# Patient Record
Sex: Female | Born: 1950 | Race: White | Hispanic: No | Marital: Married | Smoking: Former smoker
Health system: Southern US, Community
[De-identification: ages and names within clinical notes are randomized; demographics above are authoritative.]

## PROBLEM LIST (undated history)

## (undated) DIAGNOSIS — I10 Essential (primary) hypertension: Secondary | ICD-10-CM

## (undated) HISTORY — PX: AUGMENTATION MAMMAPLASTY: SUR837

## (undated) HISTORY — PX: ABDOMINAL HYSTERECTOMY: SHX81

---

## 2004-04-09 ENCOUNTER — Encounter: Payer: Self-pay | Admitting: Internal Medicine

## 2004-05-10 ENCOUNTER — Encounter: Payer: Self-pay | Admitting: Internal Medicine

## 2004-06-09 ENCOUNTER — Encounter: Payer: Self-pay | Admitting: Internal Medicine

## 2004-07-14 ENCOUNTER — Encounter: Payer: Self-pay | Admitting: Internal Medicine

## 2004-08-10 ENCOUNTER — Encounter: Payer: Self-pay | Admitting: Internal Medicine

## 2004-09-07 ENCOUNTER — Encounter: Payer: Self-pay | Admitting: Internal Medicine

## 2004-09-23 ENCOUNTER — Ambulatory Visit: Payer: Self-pay | Admitting: Unknown Physician Specialty

## 2004-10-07 ENCOUNTER — Ambulatory Visit: Payer: Self-pay | Admitting: Unknown Physician Specialty

## 2004-10-08 ENCOUNTER — Encounter: Payer: Self-pay | Admitting: Internal Medicine

## 2004-11-07 ENCOUNTER — Encounter: Payer: Self-pay | Admitting: Internal Medicine

## 2004-12-08 ENCOUNTER — Encounter: Payer: Self-pay | Admitting: Internal Medicine

## 2005-01-07 ENCOUNTER — Encounter: Payer: Self-pay | Admitting: Internal Medicine

## 2005-02-07 ENCOUNTER — Encounter: Payer: Self-pay | Admitting: Internal Medicine

## 2005-04-06 ENCOUNTER — Encounter: Payer: Self-pay | Admitting: Internal Medicine

## 2005-04-09 ENCOUNTER — Encounter: Payer: Self-pay | Admitting: Internal Medicine

## 2005-05-10 ENCOUNTER — Encounter: Payer: Self-pay | Admitting: Internal Medicine

## 2005-05-19 ENCOUNTER — Ambulatory Visit: Payer: Self-pay | Admitting: General Surgery

## 2005-05-30 ENCOUNTER — Ambulatory Visit: Payer: Self-pay | Admitting: General Surgery

## 2005-06-09 ENCOUNTER — Encounter: Payer: Self-pay | Admitting: Internal Medicine

## 2005-07-10 ENCOUNTER — Encounter: Payer: Self-pay | Admitting: Internal Medicine

## 2005-08-10 ENCOUNTER — Encounter: Payer: Self-pay | Admitting: Internal Medicine

## 2005-09-07 ENCOUNTER — Encounter: Payer: Self-pay | Admitting: Internal Medicine

## 2005-10-09 ENCOUNTER — Encounter: Payer: Self-pay | Admitting: Internal Medicine

## 2005-11-07 ENCOUNTER — Encounter: Payer: Self-pay | Admitting: Internal Medicine

## 2005-12-08 ENCOUNTER — Encounter: Payer: Self-pay | Admitting: Internal Medicine

## 2005-12-15 ENCOUNTER — Ambulatory Visit: Payer: Self-pay | Admitting: General Surgery

## 2006-01-07 ENCOUNTER — Encounter: Payer: Self-pay | Admitting: Internal Medicine

## 2006-02-07 ENCOUNTER — Encounter: Payer: Self-pay | Admitting: Internal Medicine

## 2006-03-10 ENCOUNTER — Encounter: Payer: Self-pay | Admitting: Internal Medicine

## 2006-04-09 ENCOUNTER — Encounter: Payer: Self-pay | Admitting: Internal Medicine

## 2006-05-10 ENCOUNTER — Encounter: Payer: Self-pay | Admitting: Internal Medicine

## 2006-06-09 ENCOUNTER — Encounter: Payer: Self-pay | Admitting: Internal Medicine

## 2006-07-20 ENCOUNTER — Encounter: Payer: Self-pay | Admitting: Internal Medicine

## 2006-08-10 ENCOUNTER — Encounter: Payer: Self-pay | Admitting: Internal Medicine

## 2006-09-08 ENCOUNTER — Encounter: Payer: Self-pay | Admitting: Internal Medicine

## 2006-10-09 ENCOUNTER — Encounter: Payer: Self-pay | Admitting: Internal Medicine

## 2006-11-08 ENCOUNTER — Encounter: Payer: Self-pay | Admitting: Internal Medicine

## 2006-12-09 ENCOUNTER — Encounter: Payer: Self-pay | Admitting: Internal Medicine

## 2007-01-08 ENCOUNTER — Encounter: Payer: Self-pay | Admitting: Internal Medicine

## 2007-02-01 ENCOUNTER — Ambulatory Visit: Payer: Self-pay | Admitting: General Surgery

## 2007-02-08 ENCOUNTER — Encounter: Payer: Self-pay | Admitting: Internal Medicine

## 2007-03-11 ENCOUNTER — Encounter: Payer: Self-pay | Admitting: Internal Medicine

## 2007-04-10 ENCOUNTER — Encounter: Payer: Self-pay | Admitting: Internal Medicine

## 2007-05-11 ENCOUNTER — Encounter: Payer: Self-pay | Admitting: Internal Medicine

## 2007-06-10 ENCOUNTER — Encounter: Payer: Self-pay | Admitting: Internal Medicine

## 2007-07-11 ENCOUNTER — Encounter: Payer: Self-pay | Admitting: Internal Medicine

## 2007-08-11 ENCOUNTER — Encounter: Payer: Self-pay | Admitting: Internal Medicine

## 2007-09-08 ENCOUNTER — Encounter: Payer: Self-pay | Admitting: Internal Medicine

## 2007-10-09 ENCOUNTER — Encounter: Payer: Self-pay | Admitting: Internal Medicine

## 2007-11-08 ENCOUNTER — Encounter: Payer: Self-pay | Admitting: Internal Medicine

## 2007-11-25 ENCOUNTER — Ambulatory Visit: Payer: Self-pay | Admitting: Internal Medicine

## 2007-11-25 ENCOUNTER — Ambulatory Visit: Payer: Self-pay | Admitting: Unknown Physician Specialty

## 2007-12-09 ENCOUNTER — Encounter: Payer: Self-pay | Admitting: Internal Medicine

## 2007-12-10 ENCOUNTER — Ambulatory Visit: Payer: Self-pay | Admitting: Unknown Physician Specialty

## 2007-12-13 ENCOUNTER — Encounter: Admission: RE | Admit: 2007-12-13 | Discharge: 2007-12-13 | Payer: Self-pay

## 2008-01-08 ENCOUNTER — Encounter: Payer: Self-pay | Admitting: Internal Medicine

## 2008-02-08 ENCOUNTER — Encounter: Payer: Self-pay | Admitting: Internal Medicine

## 2008-03-10 ENCOUNTER — Encounter: Payer: Self-pay | Admitting: Internal Medicine

## 2008-04-09 ENCOUNTER — Encounter: Payer: Self-pay | Admitting: Internal Medicine

## 2008-05-15 ENCOUNTER — Encounter: Payer: Self-pay | Admitting: Internal Medicine

## 2008-06-09 ENCOUNTER — Encounter: Payer: Self-pay | Admitting: Internal Medicine

## 2008-07-10 ENCOUNTER — Encounter: Payer: Self-pay | Admitting: Internal Medicine

## 2008-08-10 ENCOUNTER — Encounter: Payer: Self-pay | Admitting: Internal Medicine

## 2008-09-07 ENCOUNTER — Encounter: Payer: Self-pay | Admitting: Internal Medicine

## 2008-10-08 ENCOUNTER — Encounter: Payer: Self-pay | Admitting: Internal Medicine

## 2008-11-07 ENCOUNTER — Encounter: Payer: Self-pay | Admitting: Internal Medicine

## 2008-12-08 ENCOUNTER — Encounter: Payer: Self-pay | Admitting: Internal Medicine

## 2009-01-07 ENCOUNTER — Encounter: Payer: Self-pay | Admitting: Internal Medicine

## 2009-02-07 ENCOUNTER — Encounter: Payer: Self-pay | Admitting: Internal Medicine

## 2009-03-10 ENCOUNTER — Encounter: Payer: Self-pay | Admitting: Internal Medicine

## 2009-04-09 ENCOUNTER — Encounter: Payer: Self-pay | Admitting: Internal Medicine

## 2009-05-10 ENCOUNTER — Encounter: Payer: Self-pay | Admitting: Internal Medicine

## 2009-06-09 ENCOUNTER — Encounter: Payer: Self-pay | Admitting: Internal Medicine

## 2009-06-17 ENCOUNTER — Ambulatory Visit: Payer: Self-pay | Admitting: Cardiology

## 2009-06-24 ENCOUNTER — Ambulatory Visit: Payer: Self-pay | Admitting: Cardiology

## 2009-10-11 ENCOUNTER — Ambulatory Visit: Payer: Self-pay | Admitting: Unknown Physician Specialty

## 2009-11-23 ENCOUNTER — Ambulatory Visit: Payer: Self-pay | Admitting: General Surgery

## 2010-06-10 ENCOUNTER — Ambulatory Visit: Payer: Self-pay | Admitting: General Surgery

## 2010-11-18 ENCOUNTER — Ambulatory Visit: Payer: Self-pay | Admitting: Unknown Physician Specialty

## 2012-12-20 ENCOUNTER — Ambulatory Visit: Payer: Self-pay | Admitting: Orthopedic Surgery

## 2012-12-27 ENCOUNTER — Ambulatory Visit: Payer: Self-pay | Admitting: Nurse Practitioner

## 2013-03-31 ENCOUNTER — Other Ambulatory Visit: Payer: Self-pay | Admitting: Orthopedic Surgery

## 2013-03-31 DIAGNOSIS — M549 Dorsalgia, unspecified: Secondary | ICD-10-CM

## 2013-03-31 DIAGNOSIS — M542 Cervicalgia: Secondary | ICD-10-CM

## 2013-04-09 MED ORDER — DIAZEPAM 5 MG PO TABS: 10.0000 mg | ORAL_TABLET | Freq: Once | ORAL | Status: DC

## 2013-04-11 ENCOUNTER — Ambulatory Visit
Admission: RE | Admit: 2013-04-11 | Discharge: 2013-04-11 | Disposition: A | Discharge status: Home / Self Care | Payer: BC Managed Care – PPO | Source: Ambulatory Visit | Attending: Orthopedic Surgery | Admitting: Orthopedic Surgery

## 2013-04-11 ENCOUNTER — Ambulatory Visit
Admission: RE | Admit: 2013-04-11 | Discharge: 2013-04-11 | Disposition: A | Discharge status: Home / Self Care | Payer: No Typology Code available for payment source | Source: Ambulatory Visit | Attending: Orthopedic Surgery | Admitting: Orthopedic Surgery

## 2013-04-11 VITALS — BP 166/88 | HR 58 | Resp 14 | Ht 60.0 in | Wt 115.0 lb

## 2013-04-11 DIAGNOSIS — M549 Dorsalgia, unspecified: Secondary | ICD-10-CM

## 2013-04-11 DIAGNOSIS — M542 Cervicalgia: Secondary | ICD-10-CM

## 2013-04-11 MED ORDER — IOHEXOL 300 MG/ML  SOLN
10.0000 mL | Freq: Once | INTRAMUSCULAR | Status: AC | PRN
  Administered 2013-04-11: 10 mL via INTRATHECAL

## 2013-04-11 NOTE — Progress Notes (Signed)
1030  Patient resting comfortably post procedure.    1100 Up to restroom with minimal assistance.  Tolerated well.  1130  Patient & husband given verbal discharge instructions & state they understand.    1145  Discharged to home.   Husband to drive.    Valin Massie Carmell Austria, RN 04/11/2013 1:37 PM

## 2016-07-06 ENCOUNTER — Other Ambulatory Visit: Payer: Self-pay | Admitting: Obstetrics & Gynecology

## 2016-07-06 DIAGNOSIS — Z1231 Encounter for screening mammogram for malignant neoplasm of breast: Secondary | ICD-10-CM

## 2016-08-21 ENCOUNTER — Other Ambulatory Visit: Payer: Self-pay | Admitting: Obstetrics & Gynecology

## 2016-08-21 ENCOUNTER — Ambulatory Visit
Admission: RE | Admit: 2016-08-21 | Discharge: 2016-08-21 | Disposition: A | Discharge status: Home / Self Care | Payer: Medicare Other | Source: Ambulatory Visit | Attending: Obstetrics & Gynecology | Admitting: Obstetrics & Gynecology

## 2016-08-21 DIAGNOSIS — Z1231 Encounter for screening mammogram for malignant neoplasm of breast: Secondary | ICD-10-CM

## 2016-08-24 ENCOUNTER — Other Ambulatory Visit: Payer: Self-pay | Admitting: *Deleted

## 2016-08-24 ENCOUNTER — Inpatient Hospital Stay
Admission: RE | Admit: 2016-08-24 | Discharge: 2016-08-24 | Disposition: A | Discharge status: Home / Self Care | Payer: Self-pay | Source: Ambulatory Visit | Attending: *Deleted | Admitting: *Deleted

## 2016-08-24 DIAGNOSIS — Z9289 Personal history of other medical treatment: Secondary | ICD-10-CM

## 2016-09-28 ENCOUNTER — Other Ambulatory Visit: Payer: Self-pay | Admitting: Certified Nurse Midwife

## 2017-07-24 ENCOUNTER — Other Ambulatory Visit: Payer: Self-pay | Admitting: Obstetrics & Gynecology

## 2017-07-24 DIAGNOSIS — Z1231 Encounter for screening mammogram for malignant neoplasm of breast: Secondary | ICD-10-CM

## 2017-09-03 ENCOUNTER — Ambulatory Visit
Admission: RE | Admit: 2017-09-03 | Discharge: 2017-09-03 | Disposition: A | Discharge status: Home / Self Care | Payer: Medicare Other | Source: Ambulatory Visit | Attending: Obstetrics & Gynecology | Admitting: Obstetrics & Gynecology

## 2017-09-03 DIAGNOSIS — Z1231 Encounter for screening mammogram for malignant neoplasm of breast: Secondary | ICD-10-CM | POA: Diagnosis present

## 2017-11-22 ENCOUNTER — Other Ambulatory Visit: Payer: Self-pay | Admitting: Nurse Practitioner

## 2017-11-22 DIAGNOSIS — M542 Cervicalgia: Secondary | ICD-10-CM

## 2017-11-29 ENCOUNTER — Ambulatory Visit
Admission: RE | Admit: 2017-11-29 | Discharge: 2017-11-29 | Disposition: A | Discharge status: Home / Self Care | Payer: Medicare Other | Source: Ambulatory Visit | Attending: Nurse Practitioner | Admitting: Nurse Practitioner

## 2017-11-29 DIAGNOSIS — I639 Cerebral infarction, unspecified: Secondary | ICD-10-CM | POA: Insufficient documentation

## 2017-11-29 DIAGNOSIS — M542 Cervicalgia: Secondary | ICD-10-CM | POA: Diagnosis present

## 2017-11-29 DIAGNOSIS — I998 Other disorder of circulatory system: Secondary | ICD-10-CM | POA: Diagnosis not present

## 2017-11-29 DIAGNOSIS — G319 Degenerative disease of nervous system, unspecified: Secondary | ICD-10-CM | POA: Diagnosis not present

## 2017-11-29 HISTORY — DX: Essential (primary) hypertension: I10

## 2017-11-29 MED ORDER — IOPAMIDOL (ISOVUE-300) INJECTION 61%
75.0000 mL | Freq: Once | INTRAVENOUS | Status: AC | PRN
  Administered 2017-11-29: 75 mL via INTRAVENOUS

## 2017-12-07 ENCOUNTER — Encounter: Admission: RE | Payer: Self-pay | Source: Ambulatory Visit

## 2017-12-07 ENCOUNTER — Ambulatory Visit: Admission: RE | Admit: 2017-12-07 | Payer: Medicare Other | Source: Ambulatory Visit | Admitting: Gastroenterology

## 2017-12-07 SURGERY — COLONOSCOPY WITH PROPOFOL
Anesthesia: General

## 2018-01-07 ENCOUNTER — Other Ambulatory Visit: Payer: Self-pay | Admitting: Nurse Practitioner

## 2018-01-07 DIAGNOSIS — M542 Cervicalgia: Secondary | ICD-10-CM

## 2018-01-11 ENCOUNTER — Ambulatory Visit
Admission: RE | Admit: 2018-01-11 | Discharge: 2018-01-11 | Disposition: A | Discharge status: Home / Self Care | Payer: Medicare Other | Source: Ambulatory Visit | Attending: Nurse Practitioner | Admitting: Nurse Practitioner

## 2018-01-11 DIAGNOSIS — S2232XA Fracture of one rib, left side, initial encounter for closed fracture: Secondary | ICD-10-CM | POA: Diagnosis not present

## 2018-01-11 DIAGNOSIS — M542 Cervicalgia: Secondary | ICD-10-CM | POA: Diagnosis present

## 2018-01-11 DIAGNOSIS — M4802 Spinal stenosis, cervical region: Secondary | ICD-10-CM | POA: Insufficient documentation

## 2020-06-29 ENCOUNTER — Other Ambulatory Visit: Payer: Self-pay

## 2020-06-29 ENCOUNTER — Emergency Department: Payer: Medicare Other

## 2020-06-29 ENCOUNTER — Encounter: Payer: Self-pay | Admitting: Emergency Medicine

## 2020-06-29 ENCOUNTER — Inpatient Hospital Stay: Payer: Medicare Other

## 2020-06-29 ENCOUNTER — Inpatient Hospital Stay
Admission: EM | Admit: 2020-06-29 | Discharge: 2020-07-08 | DRG: 871 | Disposition: A | Discharge status: Home Health | Payer: Medicare Other | Attending: Internal Medicine | Admitting: Internal Medicine

## 2020-06-29 DIAGNOSIS — D62 Acute posthemorrhagic anemia: Secondary | ICD-10-CM | POA: Diagnosis present

## 2020-06-29 DIAGNOSIS — I080 Rheumatic disorders of both mitral and aortic valves: Secondary | ICD-10-CM | POA: Diagnosis present

## 2020-06-29 DIAGNOSIS — I5033 Acute on chronic diastolic (congestive) heart failure: Secondary | ICD-10-CM | POA: Diagnosis not present

## 2020-06-29 DIAGNOSIS — I110 Hypertensive heart disease with heart failure: Secondary | ICD-10-CM | POA: Diagnosis present

## 2020-06-29 DIAGNOSIS — E861 Hypovolemia: Secondary | ICD-10-CM | POA: Diagnosis present

## 2020-06-29 DIAGNOSIS — Z79899 Other long term (current) drug therapy: Secondary | ICD-10-CM

## 2020-06-29 DIAGNOSIS — J9601 Acute respiratory failure with hypoxia: Secondary | ICD-10-CM | POA: Diagnosis present

## 2020-06-29 DIAGNOSIS — E43 Unspecified severe protein-calorie malnutrition: Secondary | ICD-10-CM | POA: Diagnosis present

## 2020-06-29 DIAGNOSIS — K449 Diaphragmatic hernia without obstruction or gangrene: Secondary | ICD-10-CM | POA: Diagnosis present

## 2020-06-29 DIAGNOSIS — R195 Other fecal abnormalities: Secondary | ICD-10-CM | POA: Diagnosis present

## 2020-06-29 DIAGNOSIS — K2100 Gastro-esophageal reflux disease with esophagitis, without bleeding: Secondary | ICD-10-CM | POA: Diagnosis present

## 2020-06-29 DIAGNOSIS — F0390 Unspecified dementia without behavioral disturbance: Secondary | ICD-10-CM | POA: Diagnosis present

## 2020-06-29 DIAGNOSIS — Z90710 Acquired absence of both cervix and uterus: Secondary | ICD-10-CM

## 2020-06-29 DIAGNOSIS — E871 Hypo-osmolality and hyponatremia: Secondary | ICD-10-CM | POA: Diagnosis not present

## 2020-06-29 DIAGNOSIS — I10 Essential (primary) hypertension: Secondary | ICD-10-CM | POA: Diagnosis present

## 2020-06-29 DIAGNOSIS — R590 Localized enlarged lymph nodes: Secondary | ICD-10-CM | POA: Diagnosis present

## 2020-06-29 DIAGNOSIS — G9349 Other encephalopathy: Secondary | ICD-10-CM | POA: Diagnosis present

## 2020-06-29 DIAGNOSIS — G9340 Encephalopathy, unspecified: Secondary | ICD-10-CM | POA: Diagnosis not present

## 2020-06-29 DIAGNOSIS — I7101 Dissection of ascending aorta: Secondary | ICD-10-CM | POA: Diagnosis present

## 2020-06-29 DIAGNOSIS — Z87891 Personal history of nicotine dependence: Secondary | ICD-10-CM

## 2020-06-29 DIAGNOSIS — R918 Other nonspecific abnormal finding of lung field: Secondary | ICD-10-CM | POA: Diagnosis present

## 2020-06-29 DIAGNOSIS — Z9882 Breast implant status: Secondary | ICD-10-CM

## 2020-06-29 DIAGNOSIS — I712 Thoracic aortic aneurysm, without rupture: Secondary | ICD-10-CM | POA: Diagnosis not present

## 2020-06-29 DIAGNOSIS — N390 Urinary tract infection, site not specified: Secondary | ICD-10-CM | POA: Diagnosis present

## 2020-06-29 DIAGNOSIS — Z950 Presence of cardiac pacemaker: Secondary | ICD-10-CM

## 2020-06-29 DIAGNOSIS — R32 Unspecified urinary incontinence: Secondary | ICD-10-CM | POA: Diagnosis present

## 2020-06-29 DIAGNOSIS — A419 Sepsis, unspecified organism: Principal | ICD-10-CM | POA: Diagnosis present

## 2020-06-29 DIAGNOSIS — I5043 Acute on chronic combined systolic (congestive) and diastolic (congestive) heart failure: Secondary | ICD-10-CM | POA: Diagnosis present

## 2020-06-29 DIAGNOSIS — Z7901 Long term (current) use of anticoagulants: Secondary | ICD-10-CM

## 2020-06-29 DIAGNOSIS — Z20822 Contact with and (suspected) exposure to covid-19: Secondary | ICD-10-CM | POA: Diagnosis present

## 2020-06-29 DIAGNOSIS — G9341 Metabolic encephalopathy: Secondary | ICD-10-CM | POA: Diagnosis present

## 2020-06-29 DIAGNOSIS — G2581 Restless legs syndrome: Secondary | ICD-10-CM | POA: Diagnosis present

## 2020-06-29 DIAGNOSIS — B9620 Unspecified Escherichia coli [E. coli] as the cause of diseases classified elsewhere: Secondary | ICD-10-CM | POA: Diagnosis present

## 2020-06-29 DIAGNOSIS — D649 Anemia, unspecified: Secondary | ICD-10-CM | POA: Diagnosis not present

## 2020-06-29 DIAGNOSIS — Z6823 Body mass index (BMI) 23.0-23.9, adult: Secondary | ICD-10-CM

## 2020-06-29 DIAGNOSIS — J189 Pneumonia, unspecified organism: Secondary | ICD-10-CM | POA: Diagnosis present

## 2020-06-29 DIAGNOSIS — F419 Anxiety disorder, unspecified: Secondary | ICD-10-CM | POA: Diagnosis present

## 2020-06-29 DIAGNOSIS — K922 Gastrointestinal hemorrhage, unspecified: Secondary | ICD-10-CM | POA: Diagnosis present

## 2020-06-29 DIAGNOSIS — R6520 Severe sepsis without septic shock: Secondary | ICD-10-CM | POA: Diagnosis present

## 2020-06-29 DIAGNOSIS — R0902 Hypoxemia: Secondary | ICD-10-CM

## 2020-06-29 DIAGNOSIS — D500 Iron deficiency anemia secondary to blood loss (chronic): Secondary | ICD-10-CM

## 2020-06-29 DIAGNOSIS — I7121 Aneurysm of the ascending aorta, without rupture: Secondary | ICD-10-CM | POA: Diagnosis present

## 2020-06-29 DIAGNOSIS — D696 Thrombocytopenia, unspecified: Secondary | ICD-10-CM | POA: Diagnosis present

## 2020-06-29 DIAGNOSIS — I5032 Chronic diastolic (congestive) heart failure: Secondary | ICD-10-CM | POA: Diagnosis not present

## 2020-06-29 DIAGNOSIS — E876 Hypokalemia: Secondary | ICD-10-CM | POA: Diagnosis present

## 2020-06-29 DIAGNOSIS — Z952 Presence of prosthetic heart valve: Secondary | ICD-10-CM

## 2020-06-29 LAB — URINALYSIS, COMPLETE (UACMP) WITH MICROSCOPIC
Bilirubin Urine: NEGATIVE
Glucose, UA: NEGATIVE mg/dL
Ketones, ur: NEGATIVE mg/dL
Leukocytes,Ua: NEGATIVE
Nitrite: NEGATIVE
Protein, ur: 100 mg/dL — AB
Specific Gravity, Urine: 1.019 (ref 1.005–1.030)
pH: 5 (ref 5.0–8.0)

## 2020-06-29 LAB — CBC
HCT: 31 % — ABNORMAL LOW (ref 36.0–46.0)
Hemoglobin: 10.3 g/dL — ABNORMAL LOW (ref 12.0–15.0)
MCH: 30.7 pg (ref 26.0–34.0)
MCHC: 33.2 g/dL (ref 30.0–36.0)
MCV: 92.5 fL (ref 80.0–100.0)
Platelets: 128 10*3/uL — ABNORMAL LOW (ref 150–400)
RBC: 3.35 MIL/uL — ABNORMAL LOW (ref 3.87–5.11)
RDW: 15.5 % (ref 11.5–15.5)
WBC: 21.8 10*3/uL — ABNORMAL HIGH (ref 4.0–10.5)
nRBC: 0 % (ref 0.0–0.2)

## 2020-06-29 LAB — LACTIC ACID, PLASMA
Lactic Acid, Venous: 2.3 mmol/L (ref 0.5–1.9)
Lactic Acid, Venous: 2.3 mmol/L (ref 0.5–1.9)
Lactic Acid, Venous: 2.6 mmol/L (ref 0.5–1.9)

## 2020-06-29 LAB — PROTIME-INR
INR: 2 — ABNORMAL HIGH (ref 0.8–1.2)
Prothrombin Time: 21.6 seconds — ABNORMAL HIGH (ref 11.4–15.2)

## 2020-06-29 LAB — COMPREHENSIVE METABOLIC PANEL
ALT: 13 U/L (ref 0–44)
AST: 23 U/L (ref 15–41)
Albumin: 3.5 g/dL (ref 3.5–5.0)
Alkaline Phosphatase: 80 U/L (ref 38–126)
Anion gap: 11 (ref 5–15)
BUN: 21 mg/dL (ref 8–23)
CO2: 28 mmol/L (ref 22–32)
Calcium: 9 mg/dL (ref 8.9–10.3)
Chloride: 95 mmol/L — ABNORMAL LOW (ref 98–111)
Creatinine, Ser: 0.93 mg/dL (ref 0.44–1.00)
GFR, Estimated: >60 mL/min (ref >60)
Glucose, Bld: 126 mg/dL — ABNORMAL HIGH (ref 70–99)
Potassium: 2.9 mmol/L — ABNORMAL LOW (ref 3.5–5.1)
Sodium: 134 mmol/L — ABNORMAL LOW (ref 135–145)
Total Bilirubin: 1.2 mg/dL (ref 0.3–1.2)
Total Protein: 6.7 g/dL (ref 6.5–8.1)

## 2020-06-29 LAB — DIFFERENTIAL
Abs Immature Granulocytes: 0 10*3/uL (ref 0.00–0.07)
Basophils Absolute: 0 10*3/uL (ref 0.0–0.1)
Basophils Relative: 0 %
Eosinophils Absolute: 0 10*3/uL (ref 0.0–0.5)
Eosinophils Relative: 0 %
Lymphocytes Relative: 5 %
Lymphs Abs: 1.1 10*3/uL (ref 0.7–4.0)
Monocytes Absolute: 0.4 10*3/uL (ref 0.1–1.0)
Monocytes Relative: 2 %
Neutro Abs: 20.3 10*3/uL — ABNORMAL HIGH (ref 1.7–7.7)
Neutrophils Relative %: 93 %

## 2020-06-29 LAB — APTT: aPTT: 56 seconds — ABNORMAL HIGH (ref 24–36)

## 2020-06-29 LAB — CBG MONITORING, ED: Glucose-Capillary: 107 mg/dL — ABNORMAL HIGH (ref 70–99)

## 2020-06-29 MED ORDER — ROPINIROLE HCL 0.25 MG PO TABS: 0.7500 mg | ORAL_TABLET | Freq: Every day | ORAL | Status: DC

## 2020-06-29 MED ORDER — CHLORZOXAZONE 500 MG PO TABS
500.0000 mg | ORAL_TABLET | Freq: Every day | ORAL | Status: DC
Start: 2020-06-30 — End: 2020-06-29

## 2020-06-29 MED ORDER — NORTRIPTYLINE HCL 25 MG PO CAPS: 50.0000 mg | ORAL_CAPSULE | Freq: Every day | ORAL | Status: DC

## 2020-06-29 MED ORDER — SODIUM CHLORIDE 0.9 % IV SOLN
2.0000 g | INTRAVENOUS | Status: DC
  Administered 2020-06-30 – 2020-07-03 (×4): 2 g via INTRAVENOUS
  Filled 2020-06-29 (×3): qty 20
  Filled 2020-06-29: qty 2
  Filled 2020-06-29: qty 20

## 2020-06-29 MED ORDER — ACETAMINOPHEN 325 MG PO TABS
650.0000 mg | ORAL_TABLET | Freq: Four times a day (QID) | ORAL | Status: DC | PRN
  Administered 2020-07-07: 650 mg via ORAL
  Filled 2020-06-29: qty 2

## 2020-06-29 MED ORDER — LOSARTAN POTASSIUM 50 MG PO TABS: 100.0000 mg | ORAL_TABLET | Freq: Every day | ORAL | Status: DC

## 2020-06-29 MED ORDER — ACETAMINOPHEN 650 MG RE SUPP: 650.0000 mg | Freq: Four times a day (QID) | RECTAL | Status: DC | PRN

## 2020-06-29 MED ORDER — TRAMADOL HCL 50 MG PO TABS: 50.0000 mg | ORAL_TABLET | Freq: Three times a day (TID) | ORAL | Status: DC | PRN

## 2020-06-29 MED ORDER — ATENOLOL 25 MG PO TABS
100.0000 mg | ORAL_TABLET | Freq: Every day | ORAL | Status: DC
Start: 2020-06-30 — End: 2020-07-08
  Administered 2020-06-30 – 2020-07-08 (×8): 100 mg via ORAL
  Filled 2020-06-29 (×3): qty 4
  Filled 2020-06-29: qty 1
  Filled 2020-06-29: qty 4
  Filled 2020-06-29 (×3): qty 1
  Filled 2020-06-29: qty 4
  Filled 2020-06-29: qty 1

## 2020-06-29 MED ORDER — ONDANSETRON HCL 4 MG/2ML IJ SOLN: 4.0000 mg | Freq: Four times a day (QID) | INTRAMUSCULAR | Status: DC | PRN

## 2020-06-29 MED ORDER — PIPERACILLIN-TAZOBACTAM 3.375 G IVPB 30 MIN
3.3750 g | Freq: Once | INTRAVENOUS | Status: AC
  Administered 2020-06-29: 21:00:00 3.375 g via INTRAVENOUS
  Filled 2020-06-29: qty 50

## 2020-06-29 MED ORDER — SODIUM CHLORIDE 0.9 % IV SOLN
500.0000 mg | INTRAVENOUS | Status: AC
  Administered 2020-06-30 – 2020-07-04 (×5): 500 mg via INTRAVENOUS
  Filled 2020-06-29 (×5): qty 500

## 2020-06-29 MED ORDER — MAGNESIUM SULFATE IN D5W 1-5 GM/100ML-% IV SOLN
1.0000 g | Freq: Once | INTRAVENOUS | Status: AC
  Administered 2020-06-30: 01:00:00 1 g via INTRAVENOUS
  Filled 2020-06-29: qty 100

## 2020-06-29 MED ORDER — WARFARIN SODIUM 5 MG PO TABS
5.0000 mg | ORAL_TABLET | Freq: Once | ORAL | Status: DC
  Filled 2020-06-29: qty 1

## 2020-06-29 MED ORDER — ONDANSETRON HCL 4 MG PO TABS: 4.0000 mg | ORAL_TABLET | Freq: Four times a day (QID) | ORAL | Status: DC | PRN

## 2020-06-29 MED ORDER — WARFARIN - PHARMACIST DOSING INPATIENT
Freq: Every day | Status: DC
  Filled 2020-06-29: qty 1

## 2020-06-29 MED ORDER — LACTATED RINGERS IV BOLUS
1000.0000 mL | Freq: Once | INTRAVENOUS | Status: AC
  Administered 2020-06-29: 21:00:00 1000 mL via INTRAVENOUS

## 2020-06-29 MED ORDER — VANCOMYCIN HCL IN DEXTROSE 1-5 GM/200ML-% IV SOLN
1000.0000 mg | Freq: Once | INTRAVENOUS | Status: AC
  Administered 2020-06-29: 21:00:00 1000 mg via INTRAVENOUS
  Filled 2020-06-29: qty 200

## 2020-06-29 MED ORDER — POTASSIUM CHLORIDE 2 MEQ/ML IV SOLN
INTRAVENOUS | Status: AC
  Filled 2020-06-29 (×2): qty 1000

## 2020-06-29 MED ORDER — IOHEXOL 300 MG/ML  SOLN
50.0000 mL | Freq: Once | INTRAMUSCULAR | Status: AC | PRN
  Administered 2020-06-29: 50 mL via INTRAVENOUS

## 2020-06-29 MED ORDER — SENNOSIDES-DOCUSATE SODIUM 8.6-50 MG PO TABS: 1.0000 | ORAL_TABLET | Freq: Every evening | ORAL | Status: DC | PRN

## 2020-06-29 MED ORDER — POTASSIUM CHLORIDE 10 MEQ/100ML IV SOLN
10.0000 meq | Freq: Once | INTRAVENOUS | Status: AC
  Administered 2020-06-29: 23:00:00 10 meq via INTRAVENOUS
  Filled 2020-06-29: qty 100

## 2020-06-29 NOTE — ED Provider Notes (Signed)
[facility] Emergency Department Provider Note   ____________________________________________   Event Date/Time   First MD Initiated Contact with Patient 06/29/20 2016     (approximate)  I have reviewed the triage vital signs and the nursing notes.   HISTORY  Chief Complaint Altered Mental Status  EM caveat: Confusion, poor historian, history dementia  HPI Shelby Werner is a 69 y.o. female the history of hypertension and dementia aortic valvular disease  Patient's husband  reports that throughout today patient has just been very fatigued, sleeping not wanting to get up out of bed and not eating.  Decreased appetite.  Also noticed to have a fever.  Not complaining of anything in particular but she did have an episode of urinary incontinence today which is very unusual.  Patient denies being in pain.  No chest pain or shortness of breath.  She had her flu shot a couple weeks ago and did have a cough and upper respiratory congestion following that but seems to be getting better  Her husband has noticed that she seems to be staring off at things a little bit more than she typically does  She last seem pretty normal on Sunday  Past Medical History:  Diagnosis Date  . Hypertension     Patient Active Problem List   Diagnosis Date Noted  . Sepsis (HCC) 06/29/2020    Past Surgical History:  Procedure Laterality Date  . ABDOMINAL HYSTERECTOMY    . AUGMENTATION MAMMAPLASTY Bilateral    breast implants    Prior to Admission medications   Medication Sig Start Date End Date Taking? Authorizing Provider  atenolol (TENORMIN) 100 MG tablet Take 100 mg by mouth daily. 05/31/20  Yes [provider]  butalbital-acetaminophen-caffeine (FIORICET WITH CODEINE) 50-325-40-30 MG capsule Take 1 capsule by mouth 4 (four) times daily as needed. 06/22/20  Yes [provider]  Calcium Carbonate-Vitamin D 600-400 MG-UNIT tablet Take 1 tablet by mouth  in the morning and at bedtime.   Yes [provider]  celecoxib (CELEBREX) 100 MG capsule Take 100 mg by mouth 2 (two) times daily. 06/25/20  Yes [provider]  chlorzoxazone (PARAFON) 500 MG tablet Take 500 mg by mouth daily. 06/28/20  Yes [provider]  furosemide (LASIX) 20 MG tablet Take 20 mg by mouth daily. 05/31/20  Yes [provider]  losartan (COZAAR) 100 MG tablet Take 100 mg by mouth daily. 05/31/20  Yes [provider]  nortriptyline (PAMELOR) 50 MG capsule Take 50 mg by mouth at bedtime. 05/31/20  Yes [provider]  rOPINIRole (REQUIP) 0.25 MG tablet Take 0.75 mg by mouth at bedtime. 06/25/20  Yes [provider]  traMADol (ULTRAM) 50 MG tablet Take 50 mg by mouth every 8 (eight) hours as needed. 05/24/20  Yes [provider]  warfarin (COUMADIN) 5 MG tablet Take 5 mg by mouth daily. 06/25/20  Yes [provider]    Allergies Patient has no known allergies.  Family History  Problem Relation Age of Onset  . Breast cancer Paternal Aunt   . Breast cancer Paternal Aunt     Social History Social History   Tobacco Use  . Smoking status: Former Games developer  . Smokeless tobacco: Never Used  Substance Use Topics  . Alcohol use: Not Currently  . Drug use: Never    Review of Systems Constitutional: Fever fatigue Eyes: No visual changes.  Often staring forward at things fixating on them but husband reports more than normal but  sometimes will do that at home as well ENT: No sore throat. Cardiovascular: Denies chest pain. Respiratory: Denies shortness of breath. Gastrointestinal: No abdominal pain.   Genitourinary: Negative for dysuria.  Had an episode of urinary incontinence Musculoskeletal: Negative for back pain. Skin: Negative for rash. Neurological: Negative for headaches, areas of focal weakness or numbness.    ____________________________________________   PHYSICAL EXAM:  VITAL  SIGNS: ED Triage Vitals [06/29/20 1730]  Enc Vitals Group     BP 116/66     Pulse Rate 76     Resp 20     Temp (!) 100.5 F (38.1 C)     Temp Source Oral     SpO2 99 %     Weight 102 lb (46.3 kg)     Height 5\' 2"  (1.575 m)     Head Circumference      Peak Flow      Pain Score      Pain Loc      Pain Edu?      Excl. in GC?     Constitutional: Alert and oriented to self and husband but not to year or date. Well appearing and in no acute distress.  Occasionally staring off at things and has to be redirected to conversation Eyes: Conjunctivae are normal. Head: Atraumatic. Nose: No congestion/rhinnorhea. Mouth/Throat: Mucous membranes are modestly dry. Neck: No stridor.  Cardiovascular: Slightly tachycardic rate, regular rhythm. Grossly normal heart sounds.  Good peripheral circulation. Respiratory: Normal respiratory effort except for slight tachypnea but no distress.  No retractions. Lungs CTAB. Gastrointestinal: Soft and nontender. No distention. Musculoskeletal: No lower extremity tenderness nor edema. Neurologic:  Normal speech and language. No gross focal neurologic deficits are appreciated.  Skin:  Skin is warm, dry and intact. No rash noted. Psychiatric: Mood and affect are normal. Speech and behavior are a little bit flat but clear speech and answers basic questions appropriately.  ____________________________________________   LABS (all labs ordered are listed, but only abnormal results are displayed)  Labs Reviewed  PROTIME-INR - Abnormal; Notable for the following components:      Result Value   Prothrombin Time 21.6 (*)    INR 2.0 (*)    All other components within normal limits  APTT - Abnormal; Notable for the following components:   aPTT 56 (*)    All other components within normal limits  CBC - Abnormal; Notable for the following components:   WBC 21.8 (*)    RBC 3.35 (*)    Hemoglobin 10.3 (*)    HCT 31.0 (*)    Platelets 128 (*)    All other  components within normal limits  DIFFERENTIAL - Abnormal; Notable for the following components:   Neutro Abs 20.3 (*)    All other components within normal limits  COMPREHENSIVE METABOLIC PANEL - Abnormal; Notable for the following components:   Sodium 134 (*)    Potassium 2.9 (*)    Chloride 95 (*)    Glucose, Bld 126 (*)    All other components within normal limits  URINALYSIS, COMPLETE (UACMP) WITH MICROSCOPIC - Abnormal; Notable for the following components:   Color, Urine YELLOW (*)    APPearance CLEAR (*)    Hgb urine dipstick SMALL (*)    Protein, ur 100 (*)    Bacteria, UA RARE (*)    All other components within normal limits  LACTIC ACID, PLASMA - Abnormal; Notable for the following components:   Lactic Acid, Venous 2.3 (*)  All other components within normal limits  LACTIC ACID, PLASMA - Abnormal; Notable for the following components:   Lactic Acid, Venous 2.3 (*)    All other components within normal limits  CBG MONITORING, ED - Abnormal; Notable for the following components:   Glucose-Capillary 107 (*)    All other components within normal limits  RESP PANEL BY RT-PCR (FLU A&B, COVID) [id number]  CULTURE, BLOOD (ROUTINE X 2)  CULTURE, BLOOD (ROUTINE X 2)  URINE CULTURE  MRSA PCR SCREENING  CBG MONITORING, ED   ____________________________________________  EKG  Reviewed inter by me at 1735 Heart rate 75 QRS 180 QTc 520 Ventricular pacing ____________________________________________  RADIOLOGY  CT HEAD WO CONTRAST  Result Date: 06/29/2020 CLINICAL DATA:  Neuro deficit, acute stroke suspected. Patient has history of dementia. EXAM: CT HEAD WITHOUT CONTRAST TECHNIQUE: Contiguous axial images were obtained from the base of the skull through the vertex without intravenous contrast. COMPARISON:  CT head 11/29/2017 FINDINGS: Brain: Patchy and confluent areas of decreased attenuation are noted throughout the deep and periventricular white matter of the cerebral  hemispheres bilaterally, compatible with chronic microvascular ischemic disease. Encephalomalacia of the right temporal and occipital lobes again noted. No evidence of large-territorial acute infarction. No parenchymal hemorrhage. No mass lesion. No extra-axial collection. No mass effect or midline shift. No hydrocephalus. Basilar cisterns are patent. Vascular: No hyperdense vessel. Skull: No acute fracture or focal lesion. Sinuses/Orbits: Paranasal sinuses and mastoid air cells are clear. The orbits are unremarkable. Other: None. IMPRESSION: No acute intracranial abnormality. Electronically Signed   By: Tish Frederickson M.D.   On: 06/29/2020 18:05   DG Chest Portable 1 View  Result Date: 06/29/2020 CLINICAL DATA:  69 year old female with concern for sepsis. EXAM: PORTABLE CHEST 1 VIEW COMPARISON:  Chest radiograph dated 06/24/2009. FINDINGS: Evaluation is limited due to calcified breast implants and portable technique. There is an area of increased opacity in the right upper lobe which may represent asymmetric edema but concerning for developing infiltrate. Clinical correlation is recommended. Right perihilar density, new since the prior radiograph and may represent hilar/mediastinal adenopathy or mass. Further evaluation with CT is recommended. No large pleural effusion. No pneumothorax. There is mild cardiomegaly. Median sternotomy wires and left pectoral pacemaker device. No acute osseous pathology. IMPRESSION: 1. Right upper lobe opacity concerning for developing infiltrate. 2. Right hilar/mediastinal adenopathy or mass. Further evaluation with CT is recommended. Electronically Signed   By: Elgie Collard M.D.   On: 06/29/2020 21:13    Imaging reviewed personally viewed by me.  Discussed with Dr. Antionette Char as well  ____________________________________________   PROCEDURES  Procedure(s) performed: None  Procedures  Critical Care performed: No  CRITICAL CARE Performed by: Sharyn Creamer   Total  critical care time: 30 minutes  Critical care time was exclusive of separately billable procedures and treating other patients.  Critical care was necessary to treat or prevent imminent or life-threatening deterioration.  Critical care was time spent personally by me on the following activities: development of treatment plan with patient and/or surrogate as well as nursing, discussions with consultants, evaluation of patient's response to treatment, examination of patient, obtaining history from patient or surrogate, ordering and performing treatments and interventions, ordering and review of laboratory studies, ordering and review of radiographic studies, pulse oximetry and re-evaluation of patient's condition.  ----------------------------------------- 8:37 PM on 06/29/2020 -----------------------------------------  Code sepsis initiated based on fever tachypnea tachycardia leukocytosis.  Felt to be at high risk for possible underlying etiology of infection, will empirically order  antibiotic at this point but I suspect a urinary source may be present.  Have ordered In-N-Out cath and blood cultures.  No hypotension and no lactate greater than four not meeting criteria for septic shock at this time ____________________________________________   INITIAL IMPRESSION / ASSESSMENT AND PLAN / ED COURSE  Pertinent labs & imaging results that were available during my care of the patient were reviewed by me and considered in my medical decision making (see chart for details).   Patient presents for fatigue, recent cough congestion for 2 weeks.  Change in mental status including slight confusion, increased over her baseline.  She is noted to be febrile tachycardic and slightly tachypneic.  Reassuring examination with normal alertness, but concerning signs and symptoms for sepsis including leukocytosis elevated white cell count, no severe lactic acidosis or hypotension.  Clinical Course as of 06/29/20  2141  Tue Jun 29, 2020  2016 Delay in my initial evaluation of this patient due to critical patient requiring resuscitation [MQ]    Clinical Course User Index [MQ] Sharyn CreamerQuale, Madisyn Mawhinney, MD    ----------------------------------------- 9:40 PM on 06/29/2020 -----------------------------------------  ED Sepsis - Repeat Assessment   Performed at:    ----------------------------------------- 9:40 PM on 06/29/2020 -----------------------------------------    Last Vitals:    Blood pressure 111/72, pulse (!) 125, temperature 98.4 F (36.9 C), temperature source Oral, resp. rate (!) 28, height 5\' 2"  (1.575 m), weight 46.3 kg, SpO2 100 %.  Heart:      70 - paced  Lungs:     Clear bilaterally  Capillary Refill:   Normal less than 2 seconds  Peripheral Pulse (include location): Right radial   Skin (include color):   Warm  Updated patient on diagnosis concern for possible pneumonia.  Patient being admitted, patient understand agreeable with plan.  Broad-spectrum antibiotics ordered given concerns for sepsis associated with pneumonia and unknown bacterial etiology at this time.  Seth BakeDeborah K Dowler was evaluated in Emergency Department on 06/29/2020 for the symptoms described in the history of present illness. She was evaluated in the context of the global COVID-19 pandemic, which necessitated consideration that the patient might be at risk for infection with the SARS-CoV-2 virus that causes COVID-19. Institutional protocols and algorithms that pertain to the evaluation of patients at risk for COVID-19 are in a state of rapid change based on information released by regulatory bodies including the CDC and federal and state organizations. These policies and algorithms were followed during the patient's care in the ED.  ____________________________________________   FINAL CLINICAL IMPRESSION(S) / ED DIAGNOSES  Final diagnoses:  Community acquired pneumonia of right lung, unspecified part of lung   Sepsis, due to unspecified organism, unspecified whether acute organ dysfunction present [facility](HCC)  Lung mass (suspect)        Note:  This document was prepared using Dragon voice recognition software and may include unintentional dictation errors       Sharyn CreamerQuale, Adisen Bennion, MD 06/29/20 2142

## 2020-06-29 NOTE — H&P (Signed)
History and Physical    Shelby Werner RUE:[id number] DOB: 07-Aug-1950 DOA: 06/29/2020  PCP: Gracelyn NurseJohnston, John D, MD   Patient coming from: Home   Chief Complaint: Increased confusion, lethargy, cough   HPI: Shelby BakeDeborah K Werner is a 69 y.o. 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  Patient's husband reports that patient has been coughing for close to 2 weeks now and has been more lethargic and confused for the past 2 days.  Patient is not able to provide a full history or complete ROS, but she does deny any urinary symptoms, abdominal pain, diarrhea, or vomiting.  ED Course: Upon arrival to the ED, patient is found to be febrile to 38.1 C, tachypneic in the 20s, tachycardic in the 120s, and with stable blood pressure.  EKG features a paced rhythm.  Chest x-ray is concerning for right upper lobe opacity consistent with developing infiltrate, as well as right hilar/mediastinal adenopathy versus mass.  Noncontrast head CT is negative for acute intracranial abnormality.  Chemistry panel notable for potassium 2.9.  CBC features a leukocytosis to 21,800, stable normocytic anemia, and mild thrombocytopenia.  Lactic acid is elevated to 2.3.  Blood cultures were collected and the patient was treated with vancomycin, Zosyn, a liter of LR, and IV potassium.  Review of Systems:  All other systems reviewed and apart from HPI, are negative.  Past Medical History:  Diagnosis Date  . Hypertension     Past Surgical History:  Procedure Laterality Date  . ABDOMINAL HYSTERECTOMY    . AUGMENTATION MAMMAPLASTY Bilateral    breast implants    Social History:   reports that she has quit smoking. She has never used smokeless tobacco. She reports previous alcohol  use. She reports that she does not use drugs.  No Known Allergies  Family History  Problem Relation Age of Onset  . Breast cancer Paternal Aunt   . Breast cancer Paternal Aunt      Prior to Admission medications   Medication Sig Start Date End Date Taking? Authorizing Provider  atenolol (TENORMIN) 100 MG tablet Take 100 mg by mouth daily. 05/31/20  Yes [provider]  butalbital-acetaminophen-caffeine (FIORICET WITH CODEINE) 50-325-40-30 MG capsule Take 1 capsule by mouth 4 (four) times daily as needed. 06/22/20  Yes [provider]  Calcium Carbonate-Vitamin D 600-400 MG-UNIT tablet Take 1 tablet by mouth in the morning and at bedtime.   Yes [provider]  celecoxib (CELEBREX) 100 MG capsule Take 100 mg by mouth 2 (two) times daily. 06/25/20  Yes [provider]  chlorzoxazone (PARAFON) 500 MG tablet Take 500 mg by mouth daily. 06/28/20  Yes [provider]  furosemide (LASIX) 20 MG tablet Take 20 mg by mouth daily. 05/31/20  Yes [provider]  losartan (COZAAR) 100 MG tablet Take 100 mg by mouth daily. 05/31/20  Yes [provider]  nortriptyline (PAMELOR) 50 MG capsule Take 50 mg by mouth at bedtime. 05/31/20  Yes [provider]  rOPINIRole (REQUIP) 0.25 MG tablet Take 0.75 mg by mouth at bedtime. 06/25/20  Yes [provider]  traMADol (ULTRAM) 50 MG tablet Take 50 mg by mouth every 8 (eight) hours as needed. 05/24/20  Yes [provider]  warfarin (COUMADIN) 5 MG tablet Take 5 mg by mouth daily.  06/25/20  Yes [provider]    Physical Exam: Vitals:   06/29/20 1730 06/29/20 1930 06/29/20 1938 06/29/20 2230  BP: 116/66 111/72  102/66  Pulse: 76 (!) 125  (!) 45  Resp: 20 (!) 28  (!) 22  Temp: (!) 100.5 F (38.1 C)  98.4 F (36.9 C)   TempSrc: Oral  Oral   SpO2: 99% 100%  91%  Weight: 46.3 kg     Height: 5\' 2"  (1.575 m)       Constitutional: NAD, calm  Eyes: PERTLA,  lids and conjunctivae normal ENMT: Mucous membranes are moist. Posterior pharynx clear of any exudate or lesions.   Neck: normal, supple, no masses, no thyromegaly Respiratory: Mild tachypnea, no wheezing. No pallor or cyanosis.  Cardiovascular: Rate ~120 and regular. No extremity edema.   Abdomen: No distension, no tenderness, soft. Bowel sounds active.  Musculoskeletal: no clubbing / cyanosis. No joint deformity upper and lower extremities.   Skin: no significant rashes, lesions, ulcers. Warm, dry, well-perfused. Neurologic: CN 2-12 grossly intact. Sensation intact. Moving all extremities.  Psychiatric: Alert and oriented to person and place only. Pleasant and cooperative.    Labs and Imaging on Admission: I have personally reviewed following labs and imaging studies  CBC: Recent Labs  Lab 06/29/20 1725  WBC 21.8*  NEUTROABS 20.3*  HGB 10.3*  HCT 31.0*  MCV 92.5  PLT 128*   Basic Metabolic Panel: Recent Labs  Lab 06/29/20 1725  NA 134*  K 2.9*  CL 95*  CO2 28  GLUCOSE 126*  BUN 21  CREATININE 0.93  CALCIUM 9.0   GFR: Estimated Creatinine Clearance: 41.1 mL/min (by C-G formula based on SCr of 0.93 mg/dL). Liver Function Tests: Recent Labs  Lab 06/29/20 1725  AST 23  ALT 13  ALKPHOS 80  BILITOT 1.2  PROT 6.7  ALBUMIN 3.5   No results for input(s): LIPASE, AMYLASE in the last 168 hours. No results for input(s): AMMONIA in the last 168 hours. Coagulation Profile: Recent Labs  Lab 06/29/20 1725  INR 2.0*   Cardiac Enzymes: No results for input(s): CKTOTAL, CKMB, CKMBINDEX, TROPONINI in the last 168 hours. BNP (last 3 results) No results for input(s): PROBNP in the last 8760 hours. HbA1C: No results for input(s): HGBA1C in the last 72 hours. CBG: Recent Labs  Lab 06/29/20 1724  GLUCAP 107*   Lipid Profile: No results for input(s): CHOL, HDL, LDLCALC, TRIG, CHOLHDL, LDLDIRECT in the last 72 hours. Thyroid Function Tests: No results for input(s):  TSH, T4TOTAL, FREET4, T3FREE, THYROIDAB in the last 72 hours. Anemia Panel: No results for input(s): VITAMINB12, FOLATE, FERRITIN, TIBC, IRON, RETICCTPCT in the last 72 hours. Urine analysis:    Component Value Date/Time   COLORURINE YELLOW (A) 06/29/2020 2033   APPEARANCEUR CLEAR (A) 06/29/2020 2033   LABSPEC 1.019 06/29/2020 2033   PHURINE 5.0 06/29/2020 2033   GLUCOSEU NEGATIVE 06/29/2020 2033   HGBUR SMALL (A) 06/29/2020 2033   BILIRUBINUR NEGATIVE 06/29/2020 2033   KETONESUR NEGATIVE 06/29/2020 2033   PROTEINUR 100 (A) 06/29/2020 2033   NITRITE NEGATIVE 06/29/2020 2033   LEUKOCYTESUR NEGATIVE 06/29/2020 2033   Sepsis Labs: @LABRCNTIP (procalcitonin:4,lacticidven:4) )No results found for this or any previous visit (from the past 240 hour(s)).   Radiological Exams on Admission: CT HEAD WO CONTRAST  Result Date: 06/29/2020 CLINICAL DATA:  Neuro deficit, acute stroke suspected. Patient has history of dementia. EXAM: CT HEAD WITHOUT CONTRAST TECHNIQUE: Contiguous axial images were obtained from the base of the skull  through the vertex without intravenous contrast. COMPARISON:  CT head 11/29/2017 FINDINGS: Brain: Patchy and confluent areas of decreased attenuation are noted throughout the deep and periventricular white matter of the cerebral hemispheres bilaterally, compatible with chronic microvascular ischemic disease. Encephalomalacia of the right temporal and occipital lobes again noted. No evidence of large-territorial acute infarction. No parenchymal hemorrhage. No mass lesion. No extra-axial collection. No mass effect or midline shift. No hydrocephalus. Basilar cisterns are patent. Vascular: No hyperdense vessel. Skull: No acute fracture or focal lesion. Sinuses/Orbits: Paranasal sinuses and mastoid air cells are clear. The orbits are unremarkable. Other: None. IMPRESSION: No acute intracranial abnormality. Electronically Signed   By: Tish Frederickson M.D.   On: 06/29/2020 18:05   DG  Chest Portable 1 View  Result Date: 06/29/2020 CLINICAL DATA:  69 year old female with concern for sepsis. EXAM: PORTABLE CHEST 1 VIEW COMPARISON:  Chest radiograph dated 06/24/2009. FINDINGS: Evaluation is limited due to calcified breast implants and portable technique. There is an area of increased opacity in the right upper lobe which may represent asymmetric edema but concerning for developing infiltrate. Clinical correlation is recommended. Right perihilar density, new since the prior radiograph and may represent hilar/mediastinal adenopathy or mass. Further evaluation with CT is recommended. No large pleural effusion. No pneumothorax. There is mild cardiomegaly. Median sternotomy wires and left pectoral pacemaker device. No acute osseous pathology. IMPRESSION: 1. Right upper lobe opacity concerning for developing infiltrate. 2. Right hilar/mediastinal adenopathy or mass. Further evaluation with CT is recommended. Electronically Signed   By: Elgie Collard M.D.   On: 06/29/2020 21:13    EKG: Independently reviewed. Paced rhythm.   Assessment/Plan   1. Sepsis secondary to pneumonia  - Presents with 2 weeks of cough and 2 days of lethargy and increased confusion and is found to have fever, tachycardia, tachypnea, leukocytosis, mild lactate elevation, and CXR findings concerning for pneumonia - Blood cultures collected in ED and broad-spectrum antibiotics started  - Continue antibiotics with Rocephin and azithromycin, check sputum culture, strep pneumo and legionella antigens, trend lactate and procalcitonin, and follow cultures and clinical course     2. ?Right lung mass  - Right hilar and mediastinal adenopathy or mass noted on CXR  - Further assess with CT chest    3. Acute encephalopathy  - Husband reports that she has been fatigued and confused for the past 2 days  - Head CT negative for acute findings  - Likely related to fever/sepsis on top of her dementia  - Hold nortriptyline,  Parafon, and Requip initially, check COVID pcr, continue supportive care    4. Chronic diastolic CHF  - Appears hypovolemic on admission  - Continue cautious IVF hydration, continue beta-blocker and ARB    5. Hypokalemia  - Serum potassium is 2.9 on admission  - KCl added to IVF, will repeat chemistries in am   6. Dementia - Calm in ED but unable to provide much history  - Delirium precautions   7. Aortic valve replacement  - Continue warfarin    8. Thrombocytopenia  - Platelets 128k on admission, has been intermittently low in the past  - Monitor   DVT prophylaxis: warfarin  Code Status: Full  Family Communication: Husband updated from ED  Disposition Plan:  Patient is from: Home  Anticipated d/c is to: Home  Anticipated d/c date is: 07/02/20 Patient currently: Pending improvement in sepsis parameters Consults called: None  Admission status: Inpatient     Briscoe Deutscher, MD [facility]  06/29/2020,  10:58 PM

## 2020-06-29 NOTE — Sepsis Progress Note (Signed)
Monitoring for sepsis protocol. °

## 2020-06-29 NOTE — ED Triage Notes (Addendum)
Pt via POV from home. Pt accompanied by husband, pt has a hx of dementia. Per husband has been more altered since approx 2 days ago. Husband states that pt has been doing things out of the ordinary including not speaking to husband, and urinary incontinence. Pt a hx of dementia and stroke. LKW would be Sunday according to husband. Pt is alert but not really answering questions. Pt does takes Warfarin.

## 2020-06-29 NOTE — ED Notes (Signed)
Pt taken to CT.

## 2020-06-29 NOTE — Consult Note (Signed)
ANTICOAGULATION CONSULT NOTE  Pharmacy Consult for Warfarin Indication: [employer] AVR in 1991  Labs: Recent Labs    06/29/20 1725  HGB 10.3*  HCT 31.0*  PLT 128*  APTT 56*  LABPROT 21.6*  INR 2.0*  CREATININE 0.93    Estimated Creatinine Clearance: 41.1 mL/min (by C-G formula based on SCr of 0.93 mg/dL).   Medications:  Warfarin 5 mg daily (last reported dose was 12/19 per patient's spouse)  Assessment: Patient is a 69 y/o F with medical history including AVR on warfarin who presented to the ED 12/21 with altered mental status. Patient is now being admitted for pneumonia. Pharmacy consulted to assist in managing warfarin dosing and monitoring.   Albumin: 3.5 Drug-drug interactions: azithromycin, ceftriaxone (may increase INR)  Date INR Plan  12/21 2 Warfarin 5 mg    Goal of Therapy:  INR goal 2.5 - 3.5 (per cardiology office notes)  Plan:  --Will order warfarin 5 mg x 1 today --Daily INR ordered --CBC at least q3d per protocol  Tressie Ellis 06/29/2020,10:49 PM

## 2020-06-29 NOTE — Progress Notes (Addendum)
CODE SEPSIS - PHARMACY COMMUNICATION  **Broad Spectrum Antibiotics should be administered within 1 hour of Sepsis diagnosis**  Time Code Sepsis Called/Page Received: 2038  Antibiotics Ordered: Zosyn  Time of 1st antibiotic administration: 2115  Additional action taken by pharmacy: N/A  Tressie Ellis 06/29/2020  8:55 PM

## 2020-06-29 NOTE — Consult Note (Addendum)
PHARMACY -  BRIEF ANTIBIOTIC NOTE   Pharmacy has received consult(s) for Zosyn and vancomycin from an ED provider.  The patient's profile has been reviewed for ht/wt/allergies/indication/available labs.    One time order(s) placed for --Zosyn 3.375 g IV (30 minute infusion) --Vancomycin 1 g (~22 mg/kg)  Further antibiotics/pharmacy consults should be ordered by admitting physician if indicated.                       Thank you, Tressie Ellis 06/29/2020  8:55 PM

## 2020-06-30 DIAGNOSIS — I7121 Aneurysm of the ascending aorta, without rupture: Secondary | ICD-10-CM | POA: Diagnosis present

## 2020-06-30 DIAGNOSIS — I712 Thoracic aortic aneurysm, without rupture: Secondary | ICD-10-CM

## 2020-06-30 DIAGNOSIS — I7101 Dissection of thoracic aorta: Secondary | ICD-10-CM

## 2020-06-30 DIAGNOSIS — G9340 Encephalopathy, unspecified: Secondary | ICD-10-CM | POA: Diagnosis not present

## 2020-06-30 DIAGNOSIS — J189 Pneumonia, unspecified organism: Secondary | ICD-10-CM | POA: Diagnosis not present

## 2020-06-30 DIAGNOSIS — A419 Sepsis, unspecified organism: Secondary | ICD-10-CM | POA: Diagnosis not present

## 2020-06-30 LAB — MAGNESIUM: Magnesium: 2.1 mg/dL (ref 1.7–2.4)

## 2020-06-30 LAB — RESP PANEL BY RT-PCR (FLU A&B, COVID) ARPGX2
Influenza A by PCR: NEGATIVE
Influenza B by PCR: NEGATIVE
SARS Coronavirus 2 by RT PCR: NEGATIVE

## 2020-06-30 LAB — BASIC METABOLIC PANEL
Anion gap: 9 (ref 5–15)
BUN: 19 mg/dL (ref 8–23)
CO2: 27 mmol/L (ref 22–32)
Calcium: 8.2 mg/dL — ABNORMAL LOW (ref 8.9–10.3)
Chloride: 98 mmol/L (ref 98–111)
Creatinine, Ser: 0.76 mg/dL (ref 0.44–1.00)
GFR, Estimated: >60 mL/min (ref >60)
Glucose, Bld: 100 mg/dL — ABNORMAL HIGH (ref 70–99)
Potassium: 3.4 mmol/L — ABNORMAL LOW (ref 3.5–5.1)
Sodium: 134 mmol/L — ABNORMAL LOW (ref 135–145)

## 2020-06-30 LAB — CBC
HCT: 29.1 % — ABNORMAL LOW (ref 36.0–46.0)
Hemoglobin: 9.7 g/dL — ABNORMAL LOW (ref 12.0–15.0)
MCH: 30.9 pg (ref 26.0–34.0)
MCHC: 33.3 g/dL (ref 30.0–36.0)
MCV: 92.7 fL (ref 80.0–100.0)
Platelets: 124 10*3/uL — ABNORMAL LOW (ref 150–400)
RBC: 3.14 MIL/uL — ABNORMAL LOW (ref 3.87–5.11)
RDW: 15.5 % (ref 11.5–15.5)
WBC: 17.7 10*3/uL — ABNORMAL HIGH (ref 4.0–10.5)
nRBC: 0 % (ref 0.0–0.2)

## 2020-06-30 LAB — PROTIME-INR
INR: 2.1 — ABNORMAL HIGH (ref 0.8–1.2)
Prothrombin Time: 23.1 seconds — ABNORMAL HIGH (ref 11.4–15.2)

## 2020-06-30 LAB — HIV ANTIBODY (ROUTINE TESTING W REFLEX): HIV Screen 4th Generation wRfx: NONREACTIVE

## 2020-06-30 LAB — STREP PNEUMONIAE URINARY ANTIGEN: Strep Pneumo Urinary Antigen: NEGATIVE

## 2020-06-30 LAB — HEPARIN LEVEL (UNFRACTIONATED): Heparin Unfractionated: <0.1 IU/mL — ABNORMAL LOW (ref 0.30–0.70)

## 2020-06-30 LAB — PROCALCITONIN
Procalcitonin: 11.82 ng/mL
Procalcitonin: 12.22 ng/mL

## 2020-06-30 MED ORDER — CHLORZOXAZONE 500 MG PO TABS
500.0000 mg | ORAL_TABLET | Freq: Every day | ORAL | Status: DC
  Administered 2020-07-01 – 2020-07-08 (×7): 500 mg via ORAL
  Filled 2020-06-30 (×8): qty 1

## 2020-06-30 MED ORDER — HEPARIN BOLUS VIA INFUSION
1400.0000 [IU] | Freq: Once | INTRAVENOUS | Status: AC
  Administered 2020-06-30: 17:00:00 1400 [IU] via INTRAVENOUS
  Filled 2020-06-30: qty 1400

## 2020-06-30 MED ORDER — TRAZODONE HCL 50 MG PO TABS
50.0000 mg | ORAL_TABLET | Freq: Every evening | ORAL | Status: DC | PRN
  Administered 2020-07-03 – 2020-07-07 (×4): 50 mg via ORAL
  Filled 2020-06-30 (×4): qty 1

## 2020-06-30 MED ORDER — NORTRIPTYLINE HCL 25 MG PO CAPS
50.0000 mg | ORAL_CAPSULE | Freq: Every day | ORAL | Status: DC
  Administered 2020-07-01 – 2020-07-07 (×8): 50 mg via ORAL
  Filled 2020-06-30 (×11): qty 2

## 2020-06-30 MED ORDER — LABETALOL HCL 5 MG/ML IV SOLN
10.0000 mg | INTRAVENOUS | Status: DC | PRN
  Filled 2020-06-30: qty 4

## 2020-06-30 MED ORDER — MORPHINE SULFATE (PF) 2 MG/ML IV SOLN: 1.0000 mg | INTRAVENOUS | Status: DC | PRN

## 2020-06-30 MED ORDER — ROPINIROLE HCL 0.25 MG PO TABS
0.7500 mg | ORAL_TABLET | Freq: Every day | ORAL | Status: DC
  Administered 2020-07-01 – 2020-07-07 (×8): 0.75 mg via ORAL
  Filled 2020-06-30 (×11): qty 3

## 2020-06-30 MED ORDER — HEPARIN (PORCINE) 25000 UT/250ML-% IV SOLN
1400.0000 [IU]/h | INTRAVENOUS | Status: DC
  Administered 2020-06-30: 700 [IU]/h via INTRAVENOUS
  Administered 2020-07-01: 09:00:00 1000 [IU]/h via INTRAVENOUS
  Administered 2020-07-02: 03:00:00 1400 [IU]/h via INTRAVENOUS
  Filled 2020-06-30 (×3): qty 250

## 2020-06-30 MED ORDER — SODIUM CHLORIDE 0.9 % IV SOLN: INTRAVENOUS | Status: DC

## 2020-06-30 NOTE — Hospital Course (Signed)
Shelby Werner is a 69 y.o. 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  Patient found to have RUL opacity on chest xray and right hilar/mediastinal adenopathy versus mass.    Patient admitted for further management of severe sepsis due to pneumonia.    Discussed aortic dissection finding with [facility] cardiothoracic surgeon Dr. Randel Books who felt this to be an incidental finding and likely chronic.  Given patient asymptomatic, hemodynamically stable and currently with pneumonia, transfer to [facility] for surgical evaluation was deferred.  She was provided pt's husband contact information and her clinic is to contact him to set up outpatient appointment for after patient recovered from pneumonia.

## 2020-06-30 NOTE — ED Notes (Signed)
Spoke with Denton Lank, DO. This RN was informed that pt is not going to go to [facility] anymore. Pt will be admitted to [facility] instead.

## 2020-06-30 NOTE — Sepsis Progress Note (Signed)
Notified bedside nurse of need to order and draw repeat lactic acid. 

## 2020-06-30 NOTE — ED Notes (Addendum)
Messaged Dr Antionette Char about holding the heparin drip. Received return message stating that the surgeon at [facility] wanted the patient on a heparin drip and to try and ensure patients SBP remained <130 and pulse in the 60s if possible.

## 2020-06-30 NOTE — Progress Notes (Signed)
ANTICOAGULATION CONSULT NOTE - Initial Consult  Pharmacy Consult for Heparin (warfarin on hold) Indication: mechanical valve, may need surgery  No Known Allergies  Patient Measurements: Height: 5\' 2"  (157.5 cm) Weight: 46.3 kg (102 lb) IBW/kg (Calculated) : 50.1 HEPARIN DW (KG): 46.3  Vital Signs: Temp: 98.4 F (36.9 C) (12/21 1938) Temp Source: Oral (12/21 1938) BP: 119/70 (12/22 0300) Pulse Rate: 80 (12/22 0300)  Labs: Recent Labs    06/29/20 1725  HGB 10.3*  HCT 31.0*  PLT 128*  APTT 56*  LABPROT 21.6*  INR 2.0*  CREATININE 0.93    Estimated Creatinine Clearance: 41.1 mL/min (by C-G formula based on SCr of 0.93 mg/dL).  Medical History: Past Medical History:  Diagnosis Date  . Hypertension    Medications:  (Not in a hospital admission)   Assessment: Pt on Warfarin PTA for mechanical valve.  Heparin initiated as pt may need surgery.  Goal of Therapy:  Heparin level 0.3-0.7 units/ml Monitor platelets by anticoagulation protocol: Yes   Plan:  Heparin infusion at 700 units/hr (no bolus due to elevated INR) Check HL ~ 8 hours after infusion started  07/01/20 A 06/30/2020,4:22 AM

## 2020-06-30 NOTE — Progress Notes (Signed)
ANTICOAGULATION CONSULT NOTE - Initial Consult  Pharmacy Consult for Heparin (warfarin on hold) Indication: mechanical valve, may need surgery  No Known Allergies  Patient Measurements: Height: 5\' 2"  (157.5 cm) Weight: 46.3 kg (102 lb) IBW/kg (Calculated) : 50.1 HEPARIN DW (KG): 46.3  Vital Signs: BP: 115/67 (12/22 1621) Pulse Rate: 71 (12/22 1621)  Labs: Recent Labs    06/29/20 1725 06/30/20 0626 06/30/20 1508  HGB 10.3* 9.7*  --   HCT 31.0* 29.1*  --   PLT 128* 124*  --   APTT 56*  --   --   LABPROT 21.6* 23.1*  --   INR 2.0* 2.1*  --   HEPARINUNFRC  --   --  <0.10*  CREATININE 0.93 0.76  --     Estimated Creatinine Clearance: 47.8 mL/min (by C-G formula based on SCr of 0.76 mg/dL).  Medical History: Past Medical History:  Diagnosis Date  . Hypertension    Medications:  (Not in a hospital admission)   Assessment: Pt on Warfarin PTA for mechanical valve.  Heparin initiated as pt may need surgery.  12/22 1508 HL <0.10, subtherapeutic  Goal of Therapy:  Heparin level 0.3-0.7 units/ml Monitor platelets by anticoagulation protocol: Yes   Plan:   Heparin 1400 units IV bolus  Increase Heparin drip to 850 units/hr  Next Heparin level in 8 hours  Daily CBC while on Heparin drip  1/23, PharmD, BCPS 06/30/2020 4:35 PM

## 2020-06-30 NOTE — Progress Notes (Addendum)
PROGRESS NOTE    Shelby Werner   ZOX:[id number]  DOB: 1950/09/28  PCP: Gracelyn Nurse, MD    DOA: 06/29/2020 LOS: 1   Brief Narrative    Shelby Werner is a 69 y.o. 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  Patient found to have RUL opacity on chest xray and right hilar/mediastinal adenopathy versus mass.    Patient admitted for further management of severe sepsis due to pneumonia.    Discussed aortic dissection finding with [facility] cardiothoracic surgeon Dr. Randel Books who felt this to be an incidental finding and likely chronic.  Given patient asymptomatic, hemodynamically stable and currently with pneumonia, transfer to [facility] for surgical evaluation was deferred.  She was provided pt's husband contact information and her clinic is to contact him to set up outpatient appointment for after patient recovered from pneumonia.       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 Elevated lactate and acute encephalopathy reflects organ dysfunction.   12/22 - pt denies respiratory symptoms, AMS resolved --Continue Rocephin and Zithromax --Follow cultures and antigens --Trend lactate  Urinary Tract Infection - pt poor historian so unclear if symptomatic.  Urine cx growing GNR's.   --On abx for PNA as above --Follow culture for ID &  susceptibility  Type A Aortic Dissection - seen on CT chest after xray showed right hilar and mediastinal adenopathy or mass. Transfer to [facility] was pursued, but deferred due to infection and CT surgery felt was incidental finding without urgent need for intervention.  Pt may not be great candidate. [facility] CT surgeon's office to contact husband to arrange outpatient follow up.  --on heparin which was recommended by [facility] CTS last night.  Warfarin held. --target SBP <130 and HR in 60's if possible  Acute encephalopathy due to infection - 2 day hx confusion over baseline per husband.  At baseline 12/22 on AM rounds per husband.  CT head was negative for acute findings.  Monitor.  Delirium precautions. --resume home nortriptyline, Parafon, and Requip which were initially held  Chronic diastolic CHF - hypovolemic on admission.   On IV fluids for sepsis.  Monitor volume status closely.  --continue beta-blocker and ARB    Normocytic anemia - no evidence of bleeding.  Monitor CBC.  Anemia panel with AM labs.  Hypokalemia - K 2.9 on adm, replaced.  Monitor BMP   Dementia - no behavioral issues.  Appears mild.   12/22 - at baseline mental status per husband. --Delirium precautions   Hx of aortic valve replacement - currently on heparin as above.  Will resume warfarin per pharm when off heparin.  Thrombocytopenia - Plt 128k on admission with intermittent lows in past.  Suspect due to sepsis.  Follow CBC.   DVT prophylaxis: on warfarin   Diet:  Diet Orders (From admission, onward)    Start     Ordered  06/30/20 1648  Diet regular Room service appropriate? Yes; Fluid consistency: Thin  Diet effective now       Question Answer Comment  Room service appropriate? Yes   Fluid consistency: Thin      06/30/20 1648            Code Status: Full Code    Subjective 06/30/20    Pt seen in ED holding for a bed.  She reports feeling better.  Husband came in.  He feels patient back at her  baseline mental status at this time.  We discussed finding of aortic aneurysm and husband requested to discussed with Dr. Darrold Junker whom pt follows with to assist in decision whether to pursue surgical evaluation.   Disposition Plan & Communication   Status is: Inpatient  Inpatient status remains appropriate due to severity of illness with patient on IV antibiotics for sepsis with pneumonia and pending cultures.  Dispo: The patient is from: home              Anticipated d/c is to: home              Anticipated d/c date is: 2 days              Patient currently is not medically stable for d/c.  Family Communication: husband at bedside on rounds.   Consults, Procedures, Significant Events   Consultants: None  Procedures:  None  Antimicrobials:  Anti-infectives (From admission, onward)   Start     Dose/Rate Route Frequency Ordered Stop   06/30/20 0900  azithromycin (ZITHROMAX) 500 mg in sodium chloride 0.9 % 250 mL IVPB        500 mg 250 mL/hr over 60 Minutes Intravenous Every 24 hours 06/29/20 2227 07/05/20 0859   06/30/20 0800  cefTRIAXone (ROCEPHIN) 2 g in sodium chloride 0.9 % 100 mL IVPB        2 g 200 mL/hr over 30 Minutes Intravenous Every 24 hours 06/29/20 2227 07/05/20 0759   06/29/20 2130  vancomycin (VANCOCIN) IVPB 1000 mg/200 mL premix        1,000 mg 200 mL/hr over 60 Minutes Intravenous  Once 06/29/20 2116 06/29/20 2228   06/29/20 2100  piperacillin-tazobactam (ZOSYN) IVPB 3.375 g        3.375 g 100 mL/hr over 30 Minutes Intravenous  Once 06/29/20 2054 06/29/20 2225         Objective   Vitals:   06/30/20 1700 06/30/20 1730 06/30/20 1800 06/30/20 1900  BP: 117/62 126/74 112/74 119/84  Pulse: (!) 147 92 73 74  Resp: (!) 26 (!) 22 (!) 26 (!) 23  Temp:      TempSrc:      SpO2: (!) 59% (!) 83% 100% 93%  Weight:      Height:        Intake/Output Summary (Last 24 hours) at 06/30/2020 1949 Last data filed at 06/30/2020 1610 Gross per 24 hour  Intake 200  ml  Output --  Net 200 ml   Filed Weights   06/29/20 1730  Weight: 46.3 kg    Physical Exam:  General exam: awake, alert, no acute distress HEENT: moist mucus membranes, hearing grossly normal  Respiratory system: diminished right base otherwise clear, no wheezes, rales or rhonchi, normal respiratory effort. Cardiovascular system: normal S1/S2, RRR, systolic murmur, no pedal edema.   Gastrointestinal system: soft, NT, ND +bowel sounds. Central nervous system: no gross focal neurologic deficits, normal speech Extremities: moves all, no edema, normal tone Psychiatry:  normal mood, congruent affect  Labs   Data Reviewed: I have personally reviewed following labs and imaging studies  CBC: Recent Labs  Lab 06/29/20 1725 06/30/20 0626  WBC 21.8* 17.7*  NEUTROABS 20.3*  --   HGB 10.3* 9.7*  HCT 31.0* 29.1*  MCV 92.5 92.7  PLT 128* 124*   Basic Metabolic Panel: Recent Labs  Lab 06/29/20 1725 06/30/20 0626  NA 134* 134*  K 2.9* 3.4*  CL 95* 98  CO2 28 27  GLUCOSE 126* 100*  BUN 21 19  CREATININE 0.93 0.76  CALCIUM 9.0 8.2*  MG  --  2.1   GFR: Estimated Creatinine Clearance: 47.8 mL/min (by C-G formula based on SCr of 0.76 mg/dL). Liver Function Tests: Recent Labs  Lab 06/29/20 1725  AST 23  ALT 13  ALKPHOS 80  BILITOT 1.2  PROT 6.7  ALBUMIN 3.5   No results for input(s): LIPASE, AMYLASE in the last 168 hours. No results for input(s): AMMONIA in the last 168 hours. Coagulation Profile: Recent Labs  Lab 06/29/20 1725 06/30/20 0626  INR 2.0* 2.1*   Cardiac Enzymes: No results for input(s): CKTOTAL, CKMB, CKMBINDEX, TROPONINI in the last 168 hours. BNP (last 3 results) No results for input(s): PROBNP in the last 8760 hours. HbA1C: No results for input(s): HGBA1C in the last 72 hours. CBG: Recent Labs  Lab 06/29/20 1724  GLUCAP 107*   Lipid Profile: No results for input(s): CHOL, HDL, LDLCALC, TRIG, CHOLHDL, LDLDIRECT in the last 72  hours. Thyroid Function Tests: No results for input(s): TSH, T4TOTAL, FREET4, T3FREE, THYROIDAB in the last 72 hours. Anemia Panel: No results for input(s): VITAMINB12, FOLATE, FERRITIN, TIBC, IRON, RETICCTPCT in the last 72 hours. Sepsis Labs: Recent Labs  Lab 06/29/20 1740 06/29/20 1945 06/29/20 2310 06/30/20 0256 06/30/20 0626  PROCALCITON  --   --   --  11.82 12.22  LATICACIDVEN 2.3* 2.3* 2.6*  --   --     Recent Results (from the past 240 hour(s))  Culture, blood (Routine X 2) w Reflex to ID Panel     Status: None (Preliminary result)   Collection Time: 06/29/20  7:45 PM   Specimen: BLOOD  Result Value Ref Range Status   Specimen Description BLOOD RIGHT ANTECUBITAL  Final   Special Requests   Final    BOTTLES DRAWN AEROBIC AND ANAEROBIC Blood Culture adequate volume   Culture   Final    NO GROWTH < 12 HOURS Performed at [facility], [address] [place], [place] [address]    Report Status PENDING  Incomplete  Culture, blood (Routine X 2) w Reflex to ID Panel     Status: None (Preliminary result)   Collection Time: 06/29/20  8:35 PM   Specimen: BLOOD  Result Value Ref Range Status   Specimen Description BLOOD LEFT ANTECUBITAL  Final   Special Requests   Final    BOTTLES DRAWN AEROBIC AND ANAEROBIC Blood Culture results may not be optimal due to an excessive volume of blood received in culture bottles   Culture   Final    NO GROWTH < 12 HOURS Performed at [facility], [address] [place], [place] [address]    Report Status PENDING  Incomplete  Resp Panel by RT-PCR (Flu A&B, Covid) Nasopharyngeal Swab     Status: None   Collection Time: 06/29/20 11:13 PM   Specimen: Nasopharyngeal Swab; Nasopharyngeal(NP) swabs in vial transport medium  Result Value Ref Range Status   SARS Coronavirus 2  by RT PCR NEGATIVE NEGATIVE Final    Comment: (NOTE) SARS-CoV-2 target nucleic acids are NOT DETECTED.  The SARS-CoV-2 RNA is generally detectable in  upper respiratory specimens during the acute phase of infection. The lowest concentration of SARS-CoV-2 viral copies this assay can detect is 138 copies/mL. A negative result does not preclude SARS-Cov-2 infection and should not be used as the sole basis for treatment or other patient management decisions. A negative result may occur with  improper specimen collection/handling, submission of specimen other than nasopharyngeal swab, presence of viral mutation(s) within the areas targeted by this assay, and inadequate number of viral copies(<138 copies/mL). A negative result must be combined with clinical observations, patient history, and epidemiological information. The expected result is Negative.  Fact Sheet for Patients:  BloggerCourse.com  Fact Sheet for Healthcare Providers:  SeriousBroker.it  This test is no t yet approved or cleared by the [place] FDA and  has been authorized for detection and/or diagnosis of SARS-CoV-2 by FDA under an Emergency Use Authorization (EUA). This EUA will remain  in effect (meaning this test can be used) for the duration of the COVID-19 declaration under Section 564(b)(1) of the Act, 21 U.S.C.section 360bbb-3(b)(1), unless the authorization is terminated  or revoked sooner.       Influenza A by PCR NEGATIVE NEGATIVE Final   Influenza B by PCR NEGATIVE NEGATIVE Final    Comment: (NOTE) The Xpert Xpress SARS-CoV-2/FLU/RSV plus assay is intended as an aid in the diagnosis of influenza from Nasopharyngeal swab specimens and should not be used as a sole basis for treatment. Nasal washings and aspirates are unacceptable for Xpert Xpress SARS-CoV-2/FLU/RSV testing.  Fact Sheet for Patients: BloggerCourse.com  Fact Sheet for Healthcare Providers: SeriousBroker.it  This test is not yet approved or cleared by the [place] FDA and has been  authorized for detection and/or diagnosis of SARS-CoV-2 by FDA under an Emergency Use Authorization (EUA). This EUA will remain in effect (meaning this test can be used) for the duration of the COVID-19 declaration under Section 564(b)(1) of the Act, 21 U.S.C. section 360bbb-3(b)(1), unless the authorization is terminated or revoked.  Performed at [facility], [address] [place], [place] [address]       Imaging Studies   CT HEAD WO CONTRAST  Result Date: 06/29/2020 CLINICAL DATA:  Neuro deficit, acute stroke suspected. Patient has history of dementia. EXAM: CT HEAD WITHOUT CONTRAST TECHNIQUE: Contiguous axial images were obtained from the base of the skull through the vertex without intravenous contrast. COMPARISON:  CT head 11/29/2017 FINDINGS: Brain: Patchy and confluent areas of decreased attenuation are noted throughout the deep and periventricular white matter of the cerebral hemispheres bilaterally, compatible with chronic microvascular ischemic disease. Encephalomalacia of the right temporal and occipital lobes again noted. No evidence of large-territorial acute infarction. No parenchymal hemorrhage. No mass lesion. No extra-axial collection. No mass effect or midline shift. No hydrocephalus. Basilar cisterns are patent. Vascular: No hyperdense vessel. Skull: No acute fracture or focal lesion. Sinuses/Orbits: Paranasal sinuses and mastoid air cells are clear. The orbits are unremarkable. Other: None. IMPRESSION: No acute intracranial abnormality. Electronically Signed   By: Tish Frederickson M.D.   On: 06/29/2020 18:05   CT CHEST W CONTRAST  Result Date: 06/29/2020 CLINICAL DATA:  69 year old female with abnormal chest radiograph. EXAM: CT CHEST WITH CONTRAST TECHNIQUE: Multidetector CT imaging of the chest was performed during intravenous contrast administration. CONTRAST:  50mL OMNIPAQUE IOHEXOL 300 MG/ML  SOLN COMPARISON:  Earlier radiograph dated  06/29/2020. FINDINGS:  Evaluation of this exam is limited due to respiratory motion artifact. Cardiovascular: There is moderate cardiomegaly. No pericardial effusion. There is focal area of calcification adjacent to the mechanical aortic valve. There is aneurysmal dilatation of the aortic root and ascending aorta measuring up to 7.6 cm in AP diameter. There is a dissection flap origin ating from the root of the aorta and extending superiorly in the ascending aorta terminating in the proximal arch just proximal to the takeoff of the right brachiocephalic trunk. No periaortic fluid collection. No extraluminal contrast. There is a 1.8 x 2.4 cm focal blooming of the anterior wall of the aortic root (75/2). The origins of the great vessels of the aortic arch appear patent as visualized. The descending aorta is tortuous otherwise unremarkable. Evaluation of the pulmonary arteries is limited due to suboptimal opacification and timing of the contrast. A left pectoral pacemaker device noted. Mediastinum/Nodes: Mildly enlarged right hilar lymph nodes, likely reactive. Subcarinal lymph node measures 16 mm in short axis. The esophagus is grossly unremarkable. No mediastinal fluid collection. Lungs/Pleura: There is a large area of opacity in the right upper lobe most consistent with pneumonia. Clinical correlation and follow-up to resolution recommended. Additional scattered clusters of ground-glass opacity involving the right middle lobe and right lower lobe likely pneumonia or aspiration. The left lung remains clear. There are trace bilateral pleural effusions. No pneumothorax. The central airways are patent. Upper Abdomen: A 6.5 cm cyst in the dome of the liver. Additional smaller hypodense lesions as well as a 12 mm enhancing nodule in the right lobe of the liver and a faint 2.2 x 2.1 cm enhancing lesion in the right lobe of the liver (145/2) are not characterized. Musculoskeletal: Bilateral breast implants with calcified capsule. There is  osteopenia with degenerative changes of the spine. Age indeterminate T11 compression fracture with greater than 50% loss of vertebral body height and anterior wedging. Correlation with clinical exam and point tenderness recommended. Median sternotomy wires. IMPRESSION: 1. 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 There is a 1.8 x 2.4 cm focal blooming of the anterior wall of the aortic root. Vascular surgery consult is advised. No periaortic fluid collection or evidence of rupture. 2. Multifocal pneumonia predominantly involving the right upper lobe. 3. Trace bilateral pleural effusions. 4. Moderate cardiomegaly. 5. Age indeterminate T11 compression fracture with greater than 50% loss of vertebral body height and anterior wedging. Correlation with clinical exam and point tenderness recommended. 6. Aortic Atherosclerosis (ICD10-I70.0). These results were called by telephone at the time of interpretation on 06/29/2020 at 11:47 pm to provider TIMOTHY OPYD , who verbally acknowledged these results. Electronically Signed   By: Elgie Collard M.D.   On: 06/29/2020 23:56   DG Chest Portable 1 View  Result Date: 06/29/2020 CLINICAL DATA:  69 year old female with concern for sepsis. EXAM: PORTABLE CHEST 1 VIEW COMPARISON:  Chest radiograph dated 06/24/2009. FINDINGS: Evaluation is limited due to calcified breast implants and portable technique. There is an area of increased opacity in the right upper lobe which may represent asymmetric edema but concerning for developing infiltrate. Clinical correlation is recommended. Right perihilar density, new since the prior radiograph and may represent hilar/mediastinal adenopathy or mass. Further evaluation with CT is recommended. No large pleural effusion. No pneumothorax. There is mild cardiomegaly. Median sternotomy wires and left pectoral pacemaker device. No acute osseous pathology.  IMPRESSION: 1. Right upper lobe opacity  concerning for developing infiltrate. 2. Right hilar/mediastinal adenopathy or mass. Further evaluation with CT is recommended. Electronically Signed   By: Elgie CollardArash  Radparvar M.D.   On: 06/29/2020 21:13     Medications   Scheduled Meds: . atenolol  100 mg Oral Daily   Continuous Infusions: . azithromycin Stopped (06/30/20 1230)  . cefTRIAXone (ROCEPHIN)  IV Stopped (06/30/20 1030)  . heparin 850 Units/hr (06/30/20 1652)       LOS: 1 day    Time spent: 40 minutes with >50% spent in coordination of care and at bedside.    Pennie BanterKelly A Illyanna Petillo, DO [facility]  06/30/2020, 7:49 PM    If 7PM-7AM, please contact night-coverage. How to contact the [facility] Attending or Consulting provider 7A - 7P or covering provider during after hours 7P -7A, for this patient?    1. Check the care team in [facility] and look for a) attending/consulting [facility] provider listed and b) the [facility] team listed 2. Log into [email or web address] and use [place]'s universal password to access. If you do not have the password, please contact the hospital operator. 3. Locate the [facility] provider you are looking for under [facility] and page to a number that you can be directly reached. 4. If you still have difficulty reaching the provider, please page the [facility] (Director on Call) for the Hospitalists listed on amion for assistance.

## 2020-06-30 NOTE — ED Notes (Signed)
Pt hit call bell, found to be up by toilet and had BM.  Pt dirty clothes changed, cleaned up and assisted back to chair with steady gait and one assist.

## 2020-06-30 NOTE — Progress Notes (Signed)
CT chest obtained with contrast to further characterize right hilar/mediastinal adenopathy or mass on CXR. Radiology called to say it is a type A dissection up to 7.6 cm, extending from root to arch.   Patient not complaining of chest or back pain.   Discussed with vascular surgery here who advises transfer to a facility with CT surgery. CT surgeon at [facility] recommends calling [facility] since her cardiologist is there and there are no ICU beds at [facility].   Discussed case with Dr. Randel Books of CT surgery at [facility]. Not clear that this is acute and emergent surgery not indicated. She recommends having images uploaded for them to review, keeping patient here at [facility] for now, and managing BP and HR.   Situation was discussed with the patient's husband by phone. He is not sure that his wife would want a big surgery and notes that the valve replacement in 1991 was rough on her and she was a lot younger then. He is planning to come to the hospital to discuss more later this morning.   Spoke with Clifton Custard of IT at [facility] who is sending the CT chest and CXR images to [facility] via [employer].

## 2020-07-01 ENCOUNTER — Other Ambulatory Visit: Payer: Self-pay

## 2020-07-01 DIAGNOSIS — J189 Pneumonia, unspecified organism: Secondary | ICD-10-CM | POA: Diagnosis not present

## 2020-07-01 DIAGNOSIS — R6520 Severe sepsis without septic shock: Secondary | ICD-10-CM | POA: Diagnosis not present

## 2020-07-01 DIAGNOSIS — A419 Sepsis, unspecified organism: Secondary | ICD-10-CM | POA: Diagnosis not present

## 2020-07-01 DIAGNOSIS — I5032 Chronic diastolic (congestive) heart failure: Secondary | ICD-10-CM | POA: Diagnosis not present

## 2020-07-01 LAB — URINE CULTURE: Culture: 20000 — AB

## 2020-07-01 LAB — CBC
HCT: 27.7 % — ABNORMAL LOW (ref 36.0–46.0)
Hemoglobin: 9.4 g/dL — ABNORMAL LOW (ref 12.0–15.0)
MCH: 31 pg (ref 26.0–34.0)
MCHC: 33.9 g/dL (ref 30.0–36.0)
MCV: 91.4 fL (ref 80.0–100.0)
Platelets: 126 10*3/uL — ABNORMAL LOW (ref 150–400)
RBC: 3.03 MIL/uL — ABNORMAL LOW (ref 3.87–5.11)
RDW: 15.1 % (ref 11.5–15.5)
WBC: 16.1 10*3/uL — ABNORMAL HIGH (ref 4.0–10.5)
nRBC: 0 % (ref 0.0–0.2)

## 2020-07-01 LAB — PROTIME-INR
INR: 1.5 — ABNORMAL HIGH (ref 0.8–1.2)
Prothrombin Time: 17.5 seconds — ABNORMAL HIGH (ref 11.4–15.2)

## 2020-07-01 LAB — BASIC METABOLIC PANEL
Anion gap: 8 (ref 5–15)
BUN: 17 mg/dL (ref 8–23)
CO2: 23 mmol/L (ref 22–32)
Calcium: 7.8 mg/dL — ABNORMAL LOW (ref 8.9–10.3)
Chloride: 102 mmol/L (ref 98–111)
Creatinine, Ser: 0.65 mg/dL (ref 0.44–1.00)
GFR, Estimated: >60 mL/min (ref >60)
Glucose, Bld: 109 mg/dL — ABNORMAL HIGH (ref 70–99)
Potassium: 2.9 mmol/L — ABNORMAL LOW (ref 3.5–5.1)
Sodium: 133 mmol/L — ABNORMAL LOW (ref 135–145)

## 2020-07-01 LAB — HEPARIN LEVEL (UNFRACTIONATED)
Heparin Unfractionated: 0.29 IU/mL — ABNORMAL LOW (ref 0.30–0.70)
Heparin Unfractionated: <0.1 IU/mL — ABNORMAL LOW (ref 0.30–0.70)
Heparin Unfractionated: 0.12 IU/mL — ABNORMAL LOW (ref 0.30–0.70)

## 2020-07-01 LAB — LEGIONELLA PNEUMOPHILA SEROGP 1 UR AG: L. pneumophila Serogp 1 Ur Ag: NEGATIVE

## 2020-07-01 LAB — PROCALCITONIN: Procalcitonin: 5.96 ng/mL

## 2020-07-01 LAB — LACTIC ACID, PLASMA: Lactic Acid, Venous: 1.4 mmol/L (ref 0.5–1.9)

## 2020-07-01 LAB — MAGNESIUM: Magnesium: 2 mg/dL (ref 1.7–2.4)

## 2020-07-01 MED ORDER — HEPARIN BOLUS VIA INFUSION
2000.0000 [IU] | Freq: Once | INTRAVENOUS | Status: AC
  Administered 2020-07-01: 2000 [IU] via INTRAVENOUS
  Filled 2020-07-01: qty 2000

## 2020-07-01 MED ORDER — WARFARIN - PHARMACIST DOSING INPATIENT
Freq: Every day | Status: DC
  Filled 2020-07-01: qty 1

## 2020-07-01 MED ORDER — WARFARIN SODIUM 7.5 MG PO TABS
7.5000 mg | ORAL_TABLET | Freq: Once | ORAL | Status: AC
  Administered 2020-07-01: 18:00:00 7.5 mg via ORAL
  Filled 2020-07-01 (×2): qty 1

## 2020-07-01 MED ORDER — HEPARIN BOLUS VIA INFUSION
1600.0000 [IU] | Freq: Once | INTRAVENOUS | Status: AC
  Administered 2020-07-01: 15:00:00 1600 [IU] via INTRAVENOUS
  Filled 2020-07-01: qty 1600

## 2020-07-01 MED ORDER — POTASSIUM CHLORIDE CRYS ER 20 MEQ PO TBCR
40.0000 meq | EXTENDED_RELEASE_TABLET | ORAL | Status: AC
  Administered 2020-07-01 (×2): 40 meq via ORAL
  Filled 2020-07-01 (×2): qty 2

## 2020-07-01 NOTE — Progress Notes (Signed)
ANTICOAGULATION CONSULT NOTE -  Pharmacy Consult for Heparin (warfarin on hold) Indication: mechanical valve, may need surgery  No Known Allergies  Patient Measurements: Height: 5\' 2"  (157.5 cm) Weight: 46.3 kg (102 lb) IBW/kg (Calculated) : 50.1 HEPARIN DW (KG): 46.3  Vital Signs: BP: 123/72 (12/22 2130) Pulse Rate: 77 (12/22 2130)  Labs: Recent Labs    06/29/20 1725 06/30/20 0626 06/30/20 1508 07/01/20 0041  HGB 10.3* 9.7*  --   --   HCT 31.0* 29.1*  --   --   PLT 128* 124*  --   --   APTT 56*  --   --   --   LABPROT 21.6* 23.1*  --   --   INR 2.0* 2.1*  --   --   HEPARINUNFRC  --   --  <0.10* 0.29*  CREATININE 0.93 0.76  --   --     Estimated Creatinine Clearance: 47.8 mL/min (by C-G formula based on SCr of 0.76 mg/dL).  Medical History: Past Medical History:  Diagnosis Date  . Hypertension    Medications:  (Not in a hospital admission)  Assessment: Pt on Warfarin PTA for mechanical valve.  Heparin initiated as pt may need surgery.  12/22 1508 HL <0.10, subtherapeutic 12/23 0041 HL 0.29, barely subtherapeutic  Goal of Therapy:  Heparin level 0.3-0.7 units/ml Monitor platelets by anticoagulation protocol: Yes   Plan:   Increase Heparin drip to 1000 units/hr  Next Heparin level in 8 hours  Daily CBC while on Heparin drip  1/24, PharmD 07/01/2020 1:22 AM

## 2020-07-01 NOTE — ED Notes (Signed)
Pt provided breakfast tray.

## 2020-07-01 NOTE — Progress Notes (Addendum)
ANTICOAGULATION CONSULT NOTE -  Pharmacy Consult for Heparin and Warfarin  Indication: mechanical aortic valve  No Known Allergies  Patient Measurements: Height: 5\' 2"  (157.5 cm) Weight: 53.1 kg (117 lb) IBW/kg (Calculated) : 50.1 HEPARIN DW (KG): 53.1  Vital Signs: Temp: 97.8 F (36.6 C) (12/23 1039) Temp Source: Oral (12/23 1039) BP: 110/72 (12/23 1039) Pulse Rate: 79 (12/23 1039)  Labs: Recent Labs    06/29/20 1725 06/30/20 0626 06/30/20 1508 07/01/20 0041 07/01/20 0451 07/01/20 1045  HGB 10.3* 9.7*  --   --  9.4*  --   HCT 31.0* 29.1*  --   --  27.7*  --   PLT 128* 124*  --   --  126*  --   APTT 56*  --   --   --   --   --   LABPROT 21.6* 23.1*  --   --  17.5*  --   INR 2.0* 2.1*  --   --  1.5*  --   HEPARINUNFRC  --   --  <0.10* 0.29*  --  <0.10*  CREATININE 0.93 0.76  --   --  0.65  --     Estimated Creatinine Clearance: 51.8 mL/min (by C-G formula based on SCr of 0.65 mg/dL).  Medical History: Past Medical History:  Diagnosis Date  . Hypertension    Medications:  Medications Prior to Admission  Medication Sig Dispense Refill Last Dose  . atenolol (TENORMIN) 100 MG tablet Take 100 mg by mouth daily.   Past Week at Unknown time  . butalbital-acetaminophen-caffeine (FIORICET WITH CODEINE) 50-325-40-30 MG capsule Take 1 capsule by mouth 4 (four) times daily as needed.   prn at prn  . Calcium Carbonate-Vitamin D 600-400 MG-UNIT tablet Take 1 tablet by mouth in the morning and at bedtime.   Past Week at Unknown time  . celecoxib (CELEBREX) 100 MG capsule Take 100 mg by mouth 2 (two) times daily.   Past Week at Unknown time  . chlorzoxazone (PARAFON) 500 MG tablet Take 500 mg by mouth daily.   Past Week at Unknown time  . furosemide (LASIX) 20 MG tablet Take 20 mg by mouth daily.   Past Week at Unknown time  . losartan (COZAAR) 100 MG tablet Take 100 mg by mouth daily.   Past Week at Unknown time  . nortriptyline (PAMELOR) 50 MG capsule Take 50 mg by mouth at  bedtime.   Past Week at Unknown time  . rOPINIRole (REQUIP) 0.25 MG tablet Take 0.75 mg by mouth at bedtime.   Past Week at Unknown time  . traMADol (ULTRAM) 50 MG tablet Take 50 mg by mouth every 8 (eight) hours as needed.   prn at prn  . warfarin (COUMADIN) 5 MG tablet Take 5 mg by mouth daily.   Past Week at Unknown time   Assessment: Pt on Warfarin PTA for mechanical valve. INR goal 2.5-3.5 per outpatient cardiology notes. Patient takes 5 mg PO daily (35 mg total weekly dose). INR was 2.0 (subtherapeutic) on admission. Warfarin has been held and heparin was initiated as patient might have needed surgery. Pharmacy has been consulted to resume warfarin as there are no plans for surgery at this time.  CBC stable - Hgb 10.3>9.7>9.4, plts 07/03/20  DD Interactions: Zosyn 3.375 g x 1 on 12/21 Ceftriaxone 2 g daily + Azithromycin 500 mg daily (started 12/22>>  Warfarin: Goal INR: 2.5-3.5  Date INR Warfarin Dose 12/21 2.0 Held (unsure when patient took last warfarin dose) 12/22  2.1 Held (heparin gtt started) 12/23 1.5 7.5 mg (1.5 x home dose) given patient subtherapeutic on admission  Plan:  Give warfarin 7.5 mg PO x 1 tonight given patient subtherapeutic INR on admission and doses held last two days  Daily INR and CBC  Heparin: 12/22 1508 HL <0.10, subtherapeutic 12/23 0041 HL 0.29, barely subtherapeutic 12/24 1045 HL < 0.10, subtherapeutic - confirmed with nurse no stops or line issues  Goal of Therapy:  Heparin level 0.3-0.7 units/ml Monitor platelets by anticoagulation protocol: Yes   Plan:   Give heparin bolus 1600 units x 1 dose  Increase Heparin drip to 1200 units/hr  Next Heparin level in 8 hours  Daily CBC while on Heparin drip  Reatha Armour, PharmD Pharmacy Resident  07/01/2020 4:06 PM

## 2020-07-01 NOTE — Progress Notes (Signed)
ANTICOAGULATION CONSULT NOTE -  Pharmacy Consult for Heparin and Warfarin  Indication: mechanical aortic valve  No Known Allergies  Patient Measurements: Height: 5\' 2"  (157.5 cm) Weight: 53.1 kg (117 lb) IBW/kg (Calculated) : 50.1 HEPARIN DW (KG): 53.1  Vital Signs: Temp: 97.5 F (36.4 C) (12/23 2004) Temp Source: Oral (12/23 2004) BP: 116/72 (12/23 2004) Pulse Rate: 75 (12/23 2004)  Labs: Recent Labs    06/29/20 1725 06/30/20 0626 06/30/20 1508 07/01/20 0041 07/01/20 0451 07/01/20 1045 07/01/20 2257  HGB 10.3* 9.7*  --   --  9.4*  --   --   HCT 31.0* 29.1*  --   --  27.7*  --   --   PLT 128* 124*  --   --  126*  --   --   APTT 56*  --   --   --   --   --   --   LABPROT 21.6* 23.1*  --   --  17.5*  --   --   INR 2.0* 2.1*  --   --  1.5*  --   --   HEPARINUNFRC  --   --    < > 0.29*  --  <0.10* 0.12*  CREATININE 0.93 0.76  --   --  0.65  --   --    < > = values in this interval not displayed.    Estimated Creatinine Clearance: 51.8 mL/min (by C-G formula based on SCr of 0.65 mg/dL).  Medical History: Past Medical History:  Diagnosis Date  . Hypertension    Medications:  Medications Prior to Admission  Medication Sig Dispense Refill Last Dose  . atenolol (TENORMIN) 100 MG tablet Take 100 mg by mouth daily.   Past Week at Unknown time  . butalbital-acetaminophen-caffeine (FIORICET WITH CODEINE) 50-325-40-30 MG capsule Take 1 capsule by mouth 4 (four) times daily as needed.   prn at prn  . Calcium Carbonate-Vitamin D 600-400 MG-UNIT tablet Take 1 tablet by mouth in the morning and at bedtime.   Past Week at Unknown time  . celecoxib (CELEBREX) 100 MG capsule Take 100 mg by mouth 2 (two) times daily.   Past Week at Unknown time  . chlorzoxazone (PARAFON) 500 MG tablet Take 500 mg by mouth daily.   Past Week at Unknown time  . furosemide (LASIX) 20 MG tablet Take 20 mg by mouth daily.   Past Week at Unknown time  . losartan (COZAAR) 100 MG tablet Take 100 mg by  mouth daily.   Past Week at Unknown time  . nortriptyline (PAMELOR) 50 MG capsule Take 50 mg by mouth at bedtime.   Past Week at Unknown time  . rOPINIRole (REQUIP) 0.25 MG tablet Take 0.75 mg by mouth at bedtime.   Past Week at Unknown time  . traMADol (ULTRAM) 50 MG tablet Take 50 mg by mouth every 8 (eight) hours as needed.   prn at prn  . warfarin (COUMADIN) 5 MG tablet Take 5 mg by mouth daily.   Past Week at Unknown time   Assessment: Pt on Warfarin PTA for mechanical valve. INR goal 2.5-3.5 per outpatient cardiology notes. Patient takes 5 mg PO daily (35 mg total weekly dose). INR was 2.0 (subtherapeutic) on admission. Warfarin has been held and heparin was initiated as patient might have needed surgery. Pharmacy has been consulted to resume warfarin as there are no plans for surgery at this time.  CBC stable - Hgb 10.3>9.7>9.4, plts 07/03/20  DD Interactions: Zosyn  3.375 g x 1 on 12/21 Ceftriaxone 2 g daily + Azithromycin 500 mg daily (started 12/22>>  Warfarin: Goal INR: 2.5-3.5  Date INR Warfarin Dose 12/21 2.0 Held (unsure when patient took last warfarin dose) 12/22 2.1 Held (heparin gtt started) 12/23 1.5 7.5 mg (1.5 x home dose) given patient subtherapeutic on admission  Plan:  Give warfarin 7.5 mg PO x 1 tonight given patient subtherapeutic INR on admission and doses held last two days  Daily INR and CBC  Heparin: 12/22 1508 HL <0.10, subtherapeutic 12/23 0041 HL 0.29, barely subtherapeutic 12/23 1045 HL < 0.10, subtherapeutic - confirmed with nurse no stops or line issues 12/23 2257 HL 0.12, subtherapeutic.  Goal of Therapy:  Heparin level 0.3-0.7 units/ml Monitor platelets by anticoagulation protocol: Yes   Plan:   Give heparin bolus 2000 units x 1 dose  Increase Heparin drip to 1400 units/hr  Next Heparin level in 8 hours  Daily CBC while on Heparin drip  Wayland Denis, PharmD 07/01/2020 11:30 PM

## 2020-07-01 NOTE — ED Notes (Signed)
Pt 1-person assisted to the toilet.

## 2020-07-01 NOTE — ED Notes (Signed)
Pt assisted up to bathroom, cleaned and back to bed.  Monitoring cords in place, patient denies other needs at this time.

## 2020-07-01 NOTE — Progress Notes (Signed)
ANTICOAGULATION CONSULT NOTE -  Pharmacy Consult for Heparin and Warfarin  Indication: mechanical aortic valve  No Known Allergies  Patient Measurements: Height: 5\' 2"  (157.5 cm) Weight: 46.3 kg (102 lb) IBW/kg (Calculated) : 50.1 HEPARIN DW (KG): 46.3  Vital Signs: BP: 128/68 (12/23 0815) Pulse Rate: 73 (12/23 0815)  Labs: Recent Labs    06/29/20 1725 06/30/20 0626 06/30/20 1508 07/01/20 0041 07/01/20 0451  HGB 10.3* 9.7*  --   --  9.4*  HCT 31.0* 29.1*  --   --  27.7*  PLT 128* 124*  --   --  126*  APTT 56*  --   --   --   --   LABPROT 21.6* 23.1*  --   --  17.5*  INR 2.0* 2.1*  --   --  1.5*  HEPARINUNFRC  --   --  <0.10* 0.29*  --   CREATININE 0.93 0.76  --   --  0.65    Estimated Creatinine Clearance: 47.8 mL/min (by C-G formula based on SCr of 0.65 mg/dL).  Medical History: Past Medical History:  Diagnosis Date  . Hypertension    Medications:  (Not in a hospital admission)  Assessment: Pt on Warfarin PTA for mechanical valve. INR goal 2.5-3.5 per outpatient cardiology notes. Patient takes 5 mg PO daily (35 mg total weekly dose). INR was 2.0 (subtherapeutic) on admission. Warfarin has been held nad heparin was initiated as patient might have needed surgery. Pharmacy has been consulted to resume warfarin as there are no plans for surgery at this time.   DD Interactions: Zosyn 3.375 g x 1 on 12/21 Ceftriaxone 2 g daily + Azithromycin 500 mg daily (started 12/22>>  Warfarin: Goal INR: 2.5-3.5  Date INR Warfarin Dose 12/21 2.0 Held (unsure when patient took last warfarin dose) 12/22 2.1 Held (heparin gtt started) 12/23 1.5 7.5 mg (1.5 x home dose) given patient subtherapeutic on admission  Plan:  Give warfarin 7.5 mg PO x 1 tonight given patient subtherapeutic INR on admission and doses held last two days  Daily INR and CBC   Heparin:  12/22 1508 HL <0.10, subtherapeutic 12/23 0041 HL 0.29, barely subtherapeutic  Goal of Therapy:  Heparin  level 0.3-0.7 units/ml Monitor platelets by anticoagulation protocol: Yes   Plan:   Increase Heparin drip to 1000 units/hr  Next Heparin level in 8 hours  Daily CBC while on Heparin drip  1/24, PharmD, BCPS 07/01/2020 8:35 AM

## 2020-07-01 NOTE — Progress Notes (Signed)
PROGRESS NOTE    [facility]   VHQ:[id number]  DOB: 03-19-1951  PCP: Gracelyn Nurse, MD    DOA: 06/29/2020 LOS: 2   Brief Narrative    Shelby Werner is a 69 y.o. 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  Patient found to have RUL opacity on chest xray and right hilar/mediastinal adenopathy versus mass.    Patient admitted for further management of severe sepsis due to pneumonia.    Discussed aortic dissection finding with [facility] cardiothoracic surgeon Dr. Randel Books who felt this to be an incidental finding and likely chronic.  Given patient asymptomatic, hemodynamically stable and currently with pneumonia, transfer to [facility] for surgical evaluation was deferred.  She was provided pt's husband contact information and her clinic is to contact him to set up outpatient appointment for after patient recovered from pneumonia.       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 Elevated lactate and acute encephalopathy reflects organ dysfunction.   12/22 - pt denies respiratory symptoms, AMS resolved --Continue Rocephin and Zithromax --Follow cultures and antigens --Trend lactate  Urinary Tract Infection - pt poor historian so unclear if symptomatic.  Urine cx growing GNR's.   --On abx for PNA as above --Follow culture for ID &  susceptibility  Subtherapeutic INR -  Hx of aortic valve replacement  on heparin gtt.   Warfarin to resume tonight, dosing per pharmacy   Continue heparin bridge until INR therapeutic.  Type A Aortic Dissection - seen on CT chest after xray showed right hilar and mediastinal adenopathy or mass. Transfer to [facility] was pursued, but deferred due to infection and CT surgery felt was incidental finding without urgent need for intervention.  Pt may not be great candidate. [facility] CT surgeon's office to contact husband to arrange outpatient follow up.  --on heparin which was recommended by [facility] CTS last night.  Warfarin held. --target SBP <130 and HR in 60's if possible  Acute encephalopathy due to infection - 2 day hx confusion over baseline per husband.  At baseline 12/22 on AM rounds per husband.  CT head was negative for acute findings.  Monitor.  Delirium precautions. --resume home nortriptyline, Parafon, and Requip which were initially held  Chronic diastolic CHF - hypovolemic on admission.   On IV fluids for sepsis.  Monitor volume status closely.  --continue beta-blocker and ARB    Normocytic anemia - no evidence of bleeding.  Monitor CBC.  Anemia panel with AM labs.  Hypokalemia - K 2.9 on adm, replaced.  Monitor BMP   Dementia - no behavioral issues.  Appears mild.   12/22 - at baseline mental status per husband. --Delirium precautions   Thrombocytopenia - Plt 128k on admission with intermittent lows in past.  Suspect due to sepsis.  Follow CBC.   DVT prophylaxis: on warfarin   Diet:  Diet Orders (From admission, onward)  Start     Ordered   06/30/20 1648  Diet regular Room service appropriate? Yes; Fluid consistency: Thin  Diet effective now       Question Answer Comment  Room service appropriate? Yes   Fluid consistency: Thin      06/30/20 1648            Code Status: Full Code    Subjective 07/01/20    Pt seen this AM with husband at bedside after  transferring up to the floor.  Pt sleeping and per husband and been asleep past 4 hours.  Pt does not remember anything about yesterday.  She denies any chest pain or other complaints.   Disposition Plan & Communication   Status is: Inpatient  Inpatient status remains appropriate due to severity of illness with patient on IV antibiotics for sepsis with pneumonia and pending cultures.  Dispo: The patient is from: home              Anticipated d/c is to: home              Anticipated d/c date is: 2 days              Patient currently is not medically stable for d/c.  Family Communication: husband at bedside on rounds.   Consults, Procedures, Significant Events   Consultants: None  Procedures:  None  Antimicrobials:  Anti-infectives (From admission, onward)   Start     Dose/Rate Route Frequency Ordered Stop   06/30/20 0900  azithromycin (ZITHROMAX) 500 mg in sodium chloride 0.9 % 250 mL IVPB        500 mg 250 mL/hr over 60 Minutes Intravenous Every 24 hours 06/29/20 2227 07/05/20 0859   06/30/20 0800  cefTRIAXone (ROCEPHIN) 2 g in sodium chloride 0.9 % 100 mL IVPB        2 g 200 mL/hr over 30 Minutes Intravenous Every 24 hours 06/29/20 2227 07/05/20 0759   06/29/20 2130  vancomycin (VANCOCIN) IVPB 1000 mg/200 mL premix        1,000 mg 200 mL/hr over 60 Minutes Intravenous  Once 06/29/20 2116 06/29/20 2228   06/29/20 2100  piperacillin-tazobactam (ZOSYN) IVPB 3.375 g        3.375 g 100 mL/hr over 30 Minutes Intravenous  Once 06/29/20 2054 06/29/20 2225         Objective   Vitals:   07/01/20 0845 07/01/20 1039 07/01/20 1045 07/01/20 1633  BP: 121/90 110/72  108/67  Pulse: 65 79  73  Resp: 20 (!) 28 20 20   Temp: 98.2 F (36.8 C) 97.8 F (36.6 C)  99.4 F (37.4 C)  TempSrc: Oral Oral  Oral  SpO2: 98% 100%  100%  Weight:  53.1 kg    Height:  5\' 2"  (1.575 m)      Intake/Output Summary (Last 24 hours) at 07/01/2020 1911 Last data filed at 07/01/2020 1330 Gross per  24 hour  Intake 841.66 ml  Output --  Net 841.66 ml   Filed Weights   06/29/20 1730 07/01/20 1039  Weight: 46.3 kg 53.1 kg    Physical Exam:  General exam: sleeping comfortably, wakes to voice during exam, no acute distress Respiratory system: CTAB, no wheezes, rales or rhonchi, normal respiratory effort. Cardiovascular system: normal S1/S2, RRR, systolic murmur, no pedal edema.   Gastrointestinal system: soft, non-tender  Labs   Data Reviewed: I have personally reviewed following labs and imaging studies  CBC: Recent Labs  Lab 06/29/20 1725 06/30/20  1937 07/01/20 0451  WBC 21.8* 17.7* 16.1*  NEUTROABS 20.3*  --   --   HGB 10.3* 9.7* 9.4*  HCT 31.0* 29.1* 27.7*  MCV 92.5 92.7 91.4  PLT 128* 124* 126*   Basic Metabolic Panel: Recent Labs  Lab 06/29/20 1725 06/30/20 0626 07/01/20 0451  NA 134* 134* 133*  K 2.9* 3.4* 2.9*  CL 95* 98 102  CO2 28 27 23   GLUCOSE 126* 100* 109*  BUN 21 19 17   CREATININE 0.93 0.76 0.65  CALCIUM 9.0 8.2* 7.8*  MG  --  2.1 2.0   GFR: Estimated Creatinine Clearance: 51.8 mL/min (by C-G formula based on SCr of 0.65 mg/dL). Liver Function Tests: Recent Labs  Lab 06/29/20 1725  AST 23  ALT 13  ALKPHOS 80  BILITOT 1.2  PROT 6.7  ALBUMIN 3.5   No results for input(s): LIPASE, AMYLASE in the last 168 hours. No results for input(s): AMMONIA in the last 168 hours. Coagulation Profile: Recent Labs  Lab 06/29/20 1725 06/30/20 0626 07/01/20 0451  INR 2.0* 2.1* 1.5*   Cardiac Enzymes: No results for input(s): CKTOTAL, CKMB, CKMBINDEX, TROPONINI in the last 168 hours. BNP (last 3 results) No results for input(s): PROBNP in the last 8760 hours. HbA1C: No results for input(s): HGBA1C in the last 72 hours. CBG: Recent Labs  Lab 06/29/20 1724  GLUCAP 107*   Lipid Profile: No results for input(s): CHOL, HDL, LDLCALC, TRIG, CHOLHDL, LDLDIRECT in the last 72 hours. Thyroid Function Tests: No results for input(s): TSH, T4TOTAL,  FREET4, T3FREE, THYROIDAB in the last 72 hours. Anemia Panel: No results for input(s): VITAMINB12, FOLATE, FERRITIN, TIBC, IRON, RETICCTPCT in the last 72 hours. Sepsis Labs: Recent Labs  Lab 06/29/20 1740 06/29/20 1945 06/29/20 2310 06/30/20 0256 06/30/20 0626 07/01/20 0451  PROCALCITON  --   --   --  11.82 12.22 5.96  LATICACIDVEN 2.3* 2.3* 2.6*  --   --  1.4    Recent Results (from the past 240 hour(s))  Culture, blood (Routine X 2) w Reflex to ID Panel     Status: None (Preliminary result)   Collection Time: 06/29/20  7:45 PM   Specimen: BLOOD  Result Value Ref Range Status   Specimen Description BLOOD RIGHT ANTECUBITAL  Final   Special Requests   Final    BOTTLES DRAWN AEROBIC AND ANAEROBIC Blood Culture adequate volume   Culture   Final    NO GROWTH 2 DAYS Performed at [facility], [address], [place], [address] [place]    Report Status PENDING  Incomplete  Urine Culture     Status: Abnormal   Collection Time: 06/29/20  8:33 PM   Specimen: Urine, Random  Result Value Ref Range Status   Specimen Description   Final    URINE, RANDOM Performed at [facility], [address] [place], [address] [place]    Special Requests   Final    NONE Performed at [facility], [address] [place], [address] [place]    Culture 20,000 COLONIES/mL ESCHERICHIA COLI (A)  Final   Report Status 07/01/2020 FINAL  Final   Organism ID, Bacteria ESCHERICHIA COLI (A)  Final      Susceptibility   Escherichia coli - MIC*    AMPICILLIN 4 SENSITIVE Sensitive     CEFAZOLIN <=4 SENSITIVE Sensitive     CEFEPIME <=0.12 SENSITIVE Sensitive     CEFTRIAXONE <=0.25 SENSITIVE Sensitive     CIPROFLOXACIN <=0.25 SENSITIVE Sensitive  GENTAMICIN <=1 SENSITIVE Sensitive     IMIPENEM <=0.25 SENSITIVE Sensitive     NITROFURANTOIN <=16 SENSITIVE Sensitive     TRIMETH/SULFA <=20 SENSITIVE Sensitive     AMPICILLIN/SULBACTAM <=2 SENSITIVE Sensitive     PIP/TAZO <=4  SENSITIVE Sensitive     * 20,000 COLONIES/mL ESCHERICHIA COLI  Culture, blood (Routine X 2) w Reflex to ID Panel     Status: None (Preliminary result)   Collection Time: 06/29/20  8:35 PM   Specimen: BLOOD  Result Value Ref Range Status   Specimen Description BLOOD LEFT ANTECUBITAL  Final   Special Requests   Final    BOTTLES DRAWN AEROBIC AND ANAEROBIC Blood Culture results may not be optimal due to an excessive volume of blood received in culture bottles   Culture   Final    NO GROWTH 2 DAYS Performed at [facility], [address], [place], [place] [address]    Report Status PENDING  Incomplete  Resp Panel by RT-PCR (Flu A&B, Covid) Nasopharyngeal Swab     Status: None   Collection Time: 06/29/20 11:13 PM   Specimen: Nasopharyngeal Swab; Nasopharyngeal(NP) swabs in vial transport medium  Result Value Ref Range Status   SARS Coronavirus 2 by RT PCR NEGATIVE NEGATIVE Final    Comment: (NOTE) SARS-CoV-2 target nucleic acids are NOT DETECTED.  The SARS-CoV-2 RNA is generally detectable in upper respiratory specimens during the acute phase of infection. The lowest concentration of SARS-CoV-2 viral copies this assay can detect is 138 copies/mL. A negative result does not preclude SARS-Cov-2 infection and should not be used as the sole basis for treatment or other patient management decisions. A negative result may occur with  improper specimen collection/handling, submission of specimen other than nasopharyngeal swab, presence of viral mutation(s) within the areas targeted by this assay, and inadequate number of viral copies(<138 copies/mL). A negative result must be combined with clinical observations, patient history, and epidemiological information. The expected result is Negative.  Fact Sheet for Patients:  BloggerCourse.com  Fact Sheet for Healthcare Providers:  SeriousBroker.it  This test is no t yet approved or  cleared by the [place] FDA and  has been authorized for detection and/or diagnosis of SARS-CoV-2 by FDA under an Emergency Use Authorization (EUA). This EUA will remain  in effect (meaning this test can be used) for the duration of the COVID-19 declaration under Section 564(b)(1) of the Act, 21 U.S.C.section 360bbb-3(b)(1), unless the authorization is terminated  or revoked sooner.       Influenza A by PCR NEGATIVE NEGATIVE Final   Influenza B by PCR NEGATIVE NEGATIVE Final    Comment: (NOTE) The Xpert Xpress SARS-CoV-2/FLU/RSV plus assay is intended as an aid in the diagnosis of influenza from Nasopharyngeal swab specimens and should not be used as a sole basis for treatment. Nasal washings and aspirates are unacceptable for Xpert Xpress SARS-CoV-2/FLU/RSV testing.  Fact Sheet for Patients: BloggerCourse.com  Fact Sheet for Healthcare Providers: SeriousBroker.it  This test is not yet approved or cleared by the [place] FDA and has been authorized for detection and/or diagnosis of SARS-CoV-2 by FDA under an Emergency Use Authorization (EUA). This EUA will remain in effect (meaning this test can be used) for the duration of the COVID-19 declaration under Section 564(b)(1) of the Act, 21 U.S.C. section 360bbb-3(b)(1), unless the authorization is terminated or revoked.  Performed at [facility], [address] [place], [place] [address]       Imaging Studies   CT  CHEST W CONTRAST  Result Date: 06/29/2020 CLINICAL DATA:  69 year old female with abnormal chest radiograph. EXAM: CT CHEST WITH CONTRAST TECHNIQUE: Multidetector CT imaging of the chest was performed during intravenous contrast administration. CONTRAST:  74mL OMNIPAQUE IOHEXOL 300 MG/ML  SOLN COMPARISON:  Earlier radiograph dated 06/29/2020. FINDINGS: Evaluation of this exam is limited due to respiratory motion artifact. Cardiovascular: There is  moderate cardiomegaly. No pericardial effusion. There is focal area of calcification adjacent to the mechanical aortic valve. There is aneurysmal dilatation of the aortic root and ascending aorta measuring up to 7.6 cm in AP diameter. There is a dissection flap origin ating from the root of the aorta and extending superiorly in the ascending aorta terminating in the proximal arch just proximal to the takeoff of the right brachiocephalic trunk. No periaortic fluid collection. No extraluminal contrast. There is a 1.8 x 2.4 cm focal blooming of the anterior wall of the aortic root (75/2). The origins of the great vessels of the aortic arch appear patent as visualized. The descending aorta is tortuous otherwise unremarkable. Evaluation of the pulmonary arteries is limited due to suboptimal opacification and timing of the contrast. A left pectoral pacemaker device noted. Mediastinum/Nodes: Mildly enlarged right hilar lymph nodes, likely reactive. Subcarinal lymph node measures 16 mm in short axis. The esophagus is grossly unremarkable. No mediastinal fluid collection. Lungs/Pleura: There is a large area of opacity in the right upper lobe most consistent with pneumonia. Clinical correlation and follow-up to resolution recommended. Additional scattered clusters of ground-glass opacity involving the right middle lobe and right lower lobe likely pneumonia or aspiration. The left lung remains clear. There are trace bilateral pleural effusions. No pneumothorax. The central airways are patent. Upper Abdomen: A 6.5 cm cyst in the dome of the liver. Additional smaller hypodense lesions as well as a 12 mm enhancing nodule in the right lobe of the liver and a faint 2.2 x 2.1 cm enhancing lesion in the right lobe of the liver (145/2) are not characterized. Musculoskeletal: Bilateral breast implants with calcified capsule. There is osteopenia with degenerative changes of the spine. Age indeterminate T11 compression fracture with  greater than 50% loss of vertebral body height and anterior wedging. Correlation with clinical exam and point tenderness recommended. Median sternotomy wires. IMPRESSION: 1. 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 There is a 1.8 x 2.4 cm focal blooming of the anterior wall of the aortic root. Vascular surgery consult is advised. No periaortic fluid collection or evidence of rupture. 2. Multifocal pneumonia predominantly involving the right upper lobe. 3. Trace bilateral pleural effusions. 4. Moderate cardiomegaly. 5. Age indeterminate T11 compression fracture with greater than 50% loss of vertebral body height and anterior wedging. Correlation with clinical exam and point tenderness recommended. 6. Aortic Atherosclerosis (ICD10-I70.0). These results were called by telephone at the time of interpretation on 06/29/2020 at 11:47 pm to provider TIMOTHY OPYD , who verbally acknowledged these results. Electronically Signed   By: Elgie Collard M.D.   On: 06/29/2020 23:56   DG Chest Portable 1 View  Result Date: 06/29/2020 CLINICAL DATA:  69 year old female with concern for sepsis. EXAM: PORTABLE CHEST 1 VIEW COMPARISON:  Chest radiograph dated 06/24/2009. FINDINGS: Evaluation is limited due to calcified breast implants and portable technique. There is an area of increased opacity in the right upper lobe which may represent asymmetric edema but concerning for developing infiltrate. Clinical correlation is recommended. Right perihilar  density, new since the prior radiograph and may represent hilar/mediastinal adenopathy or mass. Further evaluation with CT is recommended. No large pleural effusion. No pneumothorax. There is mild cardiomegaly. Median sternotomy wires and left pectoral pacemaker device. No acute osseous pathology. IMPRESSION: 1. Right upper lobe opacity concerning for developing infiltrate. 2. Right  hilar/mediastinal adenopathy or mass. Further evaluation with CT is recommended. Electronically Signed   By: Elgie Collard M.D.   On: 06/29/2020 21:13     Medications   Scheduled Meds: . atenolol  100 mg Oral Daily  . chlorzoxazone  500 mg Oral Daily  . nortriptyline  50 mg Oral QHS  . rOPINIRole  0.75 mg Oral QHS  . Warfarin - Pharmacist Dosing Inpatient   Does not apply q1600   Continuous Infusions: . sodium chloride 75 mL/hr at 07/01/20 1127  . azithromycin Stopped (07/01/20 1128)  . cefTRIAXone (ROCEPHIN)  IV Stopped (07/01/20 0165)  . heparin 1,200 Units/hr (07/01/20 1445)       LOS: 2 days    Time spent: 25 minutes with >50% spent in coordination of care and at bedside.    Pennie Banter, DO [facility]  07/01/2020, 7:11 PM    If 7PM-7AM, please contact night-coverage. How to contact the [facility] Attending or Consulting provider 7A - 7P or covering provider during after hours 7P -7A, for this patient?    1. Check the care team in [facility] and look for a) attending/consulting [facility] provider listed and b) the [facility] team listed 2. Log into [email or web address] and use [place]'s universal password to access. If you do not have the password, please contact the hospital operator. 3. Locate the [facility] provider you are looking for under [facility] and page to a number that you can be directly reached. 4. If you still have difficulty reaching the provider, please page the [facility] (Director on Call) for the Hospitalists listed on amion for assistance.

## 2020-07-02 DIAGNOSIS — E43 Unspecified severe protein-calorie malnutrition: Secondary | ICD-10-CM

## 2020-07-02 DIAGNOSIS — A419 Sepsis, unspecified organism: Secondary | ICD-10-CM | POA: Diagnosis not present

## 2020-07-02 DIAGNOSIS — E876 Hypokalemia: Secondary | ICD-10-CM | POA: Diagnosis not present

## 2020-07-02 DIAGNOSIS — J189 Pneumonia, unspecified organism: Secondary | ICD-10-CM | POA: Diagnosis not present

## 2020-07-02 LAB — RETICULOCYTES
Immature Retic Fract: 15.8 % (ref 2.3–15.9)
RBC.: 2.94 MIL/uL — ABNORMAL LOW (ref 3.87–5.11)
Retic Count, Absolute: 49.4 10*3/uL (ref 19.0–186.0)
Retic Ct Pct: 1.7 % (ref 0.4–3.1)

## 2020-07-02 LAB — IRON AND TIBC
Iron: 13 ug/dL — ABNORMAL LOW (ref 28–170)
Saturation Ratios: 6 % — ABNORMAL LOW (ref 10.4–31.8)
TIBC: 203 ug/dL — ABNORMAL LOW (ref 250–450)
UIBC: 190 ug/dL

## 2020-07-02 LAB — CBC
HCT: 27.2 % — ABNORMAL LOW (ref 36.0–46.0)
Hemoglobin: 8.9 g/dL — ABNORMAL LOW (ref 12.0–15.0)
MCH: 30.5 pg (ref 26.0–34.0)
MCHC: 32.7 g/dL (ref 30.0–36.0)
MCV: 93.2 fL (ref 80.0–100.0)
Platelets: 130 10*3/uL — ABNORMAL LOW (ref 150–400)
RBC: 2.92 MIL/uL — ABNORMAL LOW (ref 3.87–5.11)
RDW: 15.3 % (ref 11.5–15.5)
WBC: 15.1 10*3/uL — ABNORMAL HIGH (ref 4.0–10.5)
nRBC: 0 % (ref 0.0–0.2)

## 2020-07-02 LAB — HEMOGLOBIN AND HEMATOCRIT, BLOOD
HCT: 28 % — ABNORMAL LOW (ref 36.0–46.0)
HCT: 28.1 % — ABNORMAL LOW (ref 36.0–46.0)
Hemoglobin: 9 g/dL — ABNORMAL LOW (ref 12.0–15.0)
Hemoglobin: 9.2 g/dL — ABNORMAL LOW (ref 12.0–15.0)

## 2020-07-02 LAB — FERRITIN: Ferritin: 278 ng/mL (ref 11–307)

## 2020-07-02 LAB — BASIC METABOLIC PANEL
Anion gap: 8 (ref 5–15)
BUN: 13 mg/dL (ref 8–23)
CO2: 20 mmol/L — ABNORMAL LOW (ref 22–32)
Calcium: 7.8 mg/dL — ABNORMAL LOW (ref 8.9–10.3)
Chloride: 103 mmol/L (ref 98–111)
Creatinine, Ser: 0.56 mg/dL (ref 0.44–1.00)
GFR, Estimated: >60 mL/min (ref >60)
Glucose, Bld: 111 mg/dL — ABNORMAL HIGH (ref 70–99)
Potassium: 3.4 mmol/L — ABNORMAL LOW (ref 3.5–5.1)
Sodium: 131 mmol/L — ABNORMAL LOW (ref 135–145)

## 2020-07-02 LAB — VITAMIN B12: Vitamin B-12: 606 pg/mL (ref 180–914)

## 2020-07-02 LAB — HEPARIN LEVEL (UNFRACTIONATED)
Heparin Unfractionated: 0.41 IU/mL (ref 0.30–0.70)
Heparin Unfractionated: <0.1 IU/mL — ABNORMAL LOW (ref 0.30–0.70)

## 2020-07-02 LAB — PROTIME-INR
INR: 1.5 — ABNORMAL HIGH (ref 0.8–1.2)
Prothrombin Time: 17.2 seconds — ABNORMAL HIGH (ref 11.4–15.2)

## 2020-07-02 LAB — FOLATE: Folate: 7 ng/mL (ref >5.9)

## 2020-07-02 LAB — OCCULT BLOOD X 1 CARD TO LAB, STOOL: Fecal Occult Bld: POSITIVE — AB

## 2020-07-02 MED ORDER — WARFARIN SODIUM 7.5 MG PO TABS
7.5000 mg | ORAL_TABLET | Freq: Once | ORAL | Status: DC
  Filled 2020-07-02: qty 1

## 2020-07-02 MED ORDER — POTASSIUM CHLORIDE CRYS ER 20 MEQ PO TBCR
40.0000 meq | EXTENDED_RELEASE_TABLET | Freq: Once | ORAL | Status: AC
  Administered 2020-07-02: 09:00:00 40 meq via ORAL
  Filled 2020-07-02: qty 2

## 2020-07-02 MED ORDER — PANTOPRAZOLE SODIUM 40 MG IV SOLR
40.0000 mg | Freq: Two times a day (BID) | INTRAVENOUS | Status: DC
  Administered 2020-07-02 – 2020-07-03 (×3): 40 mg via INTRAVENOUS
  Filled 2020-07-02 (×3): qty 40

## 2020-07-02 MED ORDER — ENSURE ENLIVE PO LIQD
237.0000 mL | Freq: Two times a day (BID) | ORAL | Status: DC
  Administered 2020-07-04 – 2020-07-05 (×3): 237 mL via ORAL

## 2020-07-02 MED ORDER — ADULT MULTIVITAMIN W/MINERALS CH
1.0000 | ORAL_TABLET | Freq: Every day | ORAL | Status: DC
  Administered 2020-07-03 – 2020-07-08 (×5): 1 via ORAL
  Filled 2020-07-02 (×5): qty 1

## 2020-07-02 NOTE — Progress Notes (Addendum)
ANTICOAGULATION CONSULT NOTE -  Pharmacy Consult for Heparin and Warfarin  Indication: mechanical aortic valve  No Known Allergies  Patient Measurements: Height: 5\' 2"  (157.5 cm) Weight: 58.2 kg (128 lb 4.8 oz) IBW/kg (Calculated) : 50.1 HEPARIN DW (KG): 53.1  Vital Signs: Temp: 97.5 F (36.4 C) (12/24 0751) Temp Source: Oral (12/24 0751) BP: 116/70 (12/24 0751) Pulse Rate: 74 (12/24 0751)  Labs: Recent Labs    06/29/20 1725 06/30/20 0626 06/30/20 1508 07/01/20 0451 07/01/20 1045 07/01/20 2257 07/02/20 0804  HGB 10.3* 9.7*  --  9.4*  --   --  8.9*  HCT 31.0* 29.1*  --  27.7*  --   --  27.2*  PLT 128* 124*  --  126*  --   --  130*  APTT 56*  --   --   --   --   --   --   LABPROT 21.6* 23.1*  --  17.5*  --   --  17.2*  INR 2.0* 2.1*  --  1.5*  --   --  1.5*  HEPARINUNFRC  --   --    < >  --  <0.10* 0.12* 0.41  CREATININE 0.93 0.76  --  0.65  --   --  0.56   < > = values in this interval not displayed.    Estimated Creatinine Clearance: 51.8 mL/min (by C-G formula based on SCr of 0.56 mg/dL).  Medical History: Past Medical History:  Diagnosis Date  . Hypertension    Medications:  Medications Prior to Admission  Medication Sig Dispense Refill Last Dose  . atenolol (TENORMIN) 100 MG tablet Take 100 mg by mouth daily.   Past Week at Unknown time  . butalbital-acetaminophen-caffeine (FIORICET WITH CODEINE) 50-325-40-30 MG capsule Take 1 capsule by mouth 4 (four) times daily as needed.   prn at prn  . Calcium Carbonate-Vitamin D 600-400 MG-UNIT tablet Take 1 tablet by mouth in the morning and at bedtime.   Past Week at Unknown time  . celecoxib (CELEBREX) 100 MG capsule Take 100 mg by mouth 2 (two) times daily.   Past Week at Unknown time  . chlorzoxazone (PARAFON) 500 MG tablet Take 500 mg by mouth daily.   Past Week at Unknown time  . furosemide (LASIX) 20 MG tablet Take 20 mg by mouth daily.   Past Week at Unknown time  . losartan (COZAAR) 100 MG tablet Take 100 mg  by mouth daily.   Past Week at Unknown time  . nortriptyline (PAMELOR) 50 MG capsule Take 50 mg by mouth at bedtime.   Past Week at Unknown time  . rOPINIRole (REQUIP) 0.25 MG tablet Take 0.75 mg by mouth at bedtime.   Past Week at Unknown time  . traMADol (ULTRAM) 50 MG tablet Take 50 mg by mouth every 8 (eight) hours as needed.   prn at prn  . warfarin (COUMADIN) 5 MG tablet Take 5 mg by mouth daily.   Past Week at Unknown time   Assessment: Pt on Warfarin PTA for mechanical valve. INR goal 2.5-3.5 per outpatient cardiology notes. Patient takes 5 mg PO daily (35 mg total weekly dose). INR was 2.0 (subtherapeutic) on admission. Warfarin has been held and heparin was initiated as patient might have needed surgery. Pharmacy has been consulted to resume warfarin as there are no plans for surgery at this time.  CBC stable - Hgb 10.3>9.7>9.4>8.9, plts 128>124>126>130  DD Interactions: Zosyn 3.375 g x 1 on 12/21 Ceftriaxone 2 g daily +  Azithromycin 500 mg daily (12/22>> Ropinirole (12/22>>  Warfarin: Goal INR: 2.5-3.5  Date INR Warfarin Dose 12/21 2.0 Held (unsure when patient took last warfarin dose) 12/22 2.1 Held (heparin gtt started) 12/23 1.5 7.5 mg (1.5 x home dose) given patient subtherapeutic on admission 12/24 1.5 7.5 mg   Plan:  Give warfarin 7.5 mg PO x 1 given subtherapeutic INR on admission and doses held x 2 days.  Pt was subtherapeutic on arrival with 5 mg PO daily - if discharging, may consider 6 mg daily.   Daily INR and CBC  Heparin: 12/22 1508 HL <0.10, subtherapeutic 12/23 0041 HL 0.29, barely subtherapeutic 12/23 1045 HL < 0.10, subtherapeutic - confirmed with nurse no stops or line issues 12/23 2257 HL 0.12, subtherapeutic. 12/24 0804 HL 0.41, therapeutic  Goal of Therapy:  Heparin level 0.3-0.7 units/ml Monitor platelets by anticoagulation protocol: Yes   Plan:   HL therapeutic x 1, continue Heparin @ 1400 units/hr  Next Heparin level in 8  hours  Daily CBC while on Heparin drip  Reatha Armour, PharmD Pharmacy Resident  07/02/2020 8:51 AM

## 2020-07-02 NOTE — Care Management Important Message (Signed)
Important Message  Patient Details  Name: Shelby Werner MRN: [id number] Date of Birth: [birth year]   Medicare Important Message Given:  Yes     Malone Cellar, RN 07/02/2020, 11:50 AM

## 2020-07-02 NOTE — Progress Notes (Signed)
Initial Nutrition Assessment  DOCUMENTATION CODES:   Severe malnutrition in context of social or environmental circumstances  INTERVENTION:   Ensure Enlive po BID, each supplement provides 350 kcal and 20 grams of protein  Magic cup TID with meals, each supplement provides 290 kcal and 9 grams of protein  MVI daily   NUTRITION DIAGNOSIS:   Severe Malnutrition related to social / environmental circumstances (dementia) as evidenced by moderate fat depletion,severe fat depletion,moderate muscle depletion,severe muscle depletion.  GOAL:   Patient will meet greater than or equal to 90% of their needs  MONITOR:   PO intake,Supplement acceptance,Labs,Weight trends,Skin,I & O's  REASON FOR ASSESSMENT:   Malnutrition Screening Tool    ASSESSMENT:   69 y.o. female with medical history significant for aortic valve disease status post replacement in 1991 on warfarin, status post pacemaker and ICD, CVA, dementia and chronic diastolic CHF who is admitted with sepsis, PNA, UTI and found to have right hilar/mediastinal adenopathy versus mass.  Met with pt in room today. Pt is a poor historian but reports poor appetite and oral intake in hospital. Pt reports that she does not like the hospital food. Pt is unable to remember what she ate today; pt is documented to be eating sips and bites of meals. Pt reports that her family is bringing her some chicken noodle soup later and she is looking forward to eating that. Pt reports that she has drank Ensure before at home but she does not drink it regularly. RD will add supplements and MVI to help pt meet her estimated needs. Per chart, pt documented to be up ~27lbs from her UBW; RD unsure if admit weight is correct. Pt in chair at time of RD visit so new weight unable to be obtained.   Medications reviewed and include: warfarin, azithromycin, heparin  ceftriaxone   Labs reviewed: Na 131(L), K 3.4(L) Iron 13(L), TIBC 203(L), ferritin 278, folate 7.0-  12/24 Wbc- 15.1(H), Hgb 8.9(L), Hct 27.2(L)  NUTRITION - FOCUSED PHYSICAL EXAM:  Flowsheet Row Most Recent Value  Orbital Region Moderate depletion  Upper Arm Region Severe depletion  Thoracic and Lumbar Region Moderate depletion  Buccal Region Moderate depletion  Temple Region Moderate depletion  Clavicle Bone Region Severe depletion  Clavicle and Acromion Bone Region Severe depletion  Scapular Bone Region Severe depletion  Dorsal Hand Severe depletion  Patellar Region Moderate depletion  Anterior Thigh Region Moderate depletion  Posterior Calf Region Moderate depletion  Edema (RD Assessment) None  Hair Reviewed  Eyes Reviewed  Mouth Reviewed  Skin Reviewed  Nails Reviewed     Diet Order:   Diet Order            Diet regular Room service appropriate? Yes; Fluid consistency: Thin  Diet effective now                EDUCATION NEEDS:   Education needs have been addressed  Skin:  Skin Assessment: Reviewed RN Assessment (ecchymosis)  Last BM:  12/24- type 7  Height:   Ht Readings from Last 1 Encounters:  07/01/20 5' 2"  (1.575 m)    Weight:   Wt Readings from Last 1 Encounters:  07/02/20 58.2 kg    Ideal Body Weight:  50 kg  BMI:  Body mass index is 23.47 kg/m.  Estimated Nutritional Needs:   Kcal:  1400-1600kcal/day  Protein:  70-80g/day  Fluid:  1.5L/day  Koleen Distance MS, RD, LDN Please refer to [facility] for RD and/or RD on-call/weekend/after hours pager

## 2020-07-02 NOTE — Progress Notes (Addendum)
PROGRESS NOTE    Shelby BakeDeborah K Robarts   ZOX:[id number]  DOB: 1951/06/14  PCP: Gracelyn NurseJohnston, John D, MD    DOA: 06/29/2020 LOS: 3   Brief Narrative    Shelby Werner is a 69 y.o. 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  Patient found to have RUL opacity on chest xray and right hilar/mediastinal adenopathy versus mass.    Patient admitted for further management of severe sepsis due to pneumonia.    Discussed aortic dissection finding with [facility] cardiothoracic surgeon Dr. Randel BooksZwischenberger who felt this to be an incidental finding and likely chronic.  Given patient asymptomatic, hemodynamically stable and currently with pneumonia, [facility].  She was provided pt's husband contact information and her clinic is to contact him to set up outpatient appointment for after patient recovered from pneumonia.       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  Hemoglobin has been trending down from 10.3 on admission to 8.9 this morning.  FOBT is positive today. --Consult GI --Stopped warfarin and heparin drip for now --IV Protonix twice daily --Clear liquid diet for now pending GIs plan --Anemia panel pending  Severe Sepsis 2/2 community-acquired PNA - sepsis POA with tachycardia, tachypnea, leukocytosis, in  setting of PNA. Elevated lactate and acute encephalopathy reflects organ dysfunction.  Lactic acidosis has resolved and sepsis physiology improved. 12/22 - pt denies respiratory symptoms, AMS resolved --Continue Rocephin and Zithromax --Follow cultures -negative to date  Urinary Tract Infection - pt poor historian so unclear if symptomatic.  Urine cx growing GNR's.   --On abx for PNA as above --Follow culture for ID & susceptibility  Subtherapeutic INR -  Hx of aortic valve replacement  Has been on heparin gtt which was started on admission after finding of aortic dissection in case of surgical intervention which is deferred until patient recovered from infections and will be evaluated as outpatient. Resumed on warfarin last night, however patient now with GI bleeding and worsening anemia. --Anticoagulation currently on hold pending GI evaluation  Type A Aortic Dissection - seen on CT chest after xray showed right hilar and mediastinal adenopathy or mass. Transfer to [facility] was pursued, but deferred due to infection and CT surgery felt was incidental finding without urgent need for intervention.  Pt may not be great candidate. [facility] CT surgeon's office to contact husband to arrange outpatient follow up.  --on heparin which was recommended by [facility] CTS last night.  Warfarin held. --target SBP <130 and HR in 60's if possible  Acute encephalopathy due to infection - 2 day hx confusion over baseline per husband.  At baseline 12/22 on AM rounds per husband.  CT head was negative for acute findings.  Monitor.  Delirium precautions. --resume home nortriptyline, Parafon, and Requip which were initially held  Chronic diastolic CHF - hypovolemic on admission.   On  IV fluids for sepsis.  Monitor volume status closely.  --continue beta-blocker and ARB    Normocytic anemia - no evidence of bleeding.  Monitor CBC.  Anemia panel with AM labs.  Hypokalemia - K 2.9 on adm, replaced.  Monitor BMP    Dementia - no behavioral issues.  Appears mild.   12/22 - at baseline mental status per husband. --Delirium precautions   Thrombocytopenia - Plt 128k on admission with intermittent lows in past.  Suspect due to sepsis.  Follow CBC.   DVT prophylaxis: on warfarin   Diet:  Diet Orders (From admission, onward)    Start     Ordered   06/30/20 1648  Diet regular Room service appropriate? Yes; Fluid consistency: Thin  Diet effective now       Question Answer Comment  Room service appropriate? Yes   Fluid consistency: Thin      06/30/20 1648            Code Status: Full Code    Subjective 07/02/20    Pt seen this AM up in chair.  She didn't get much sleep due to interruptions by staff.  She is tired of being in hospital, especially bc it's Christmas.  Confirms black stools and says she has them intermittently at home as well.  No abdominal pain, nausea or vomiting.  Also denies fever chills, chest pain or shortness of breath.   Disposition Plan & Communication   Status is: Inpatient  Inpatient status remains appropriate due to severity of illness.  Patient has acute blood loss anemia with GI bleeding.  Dispo: The patient is from: home              Anticipated d/c is to: home              Anticipated d/c date is: 3 days              Patient currently is not medically stable for d/c.  Family Communication: husband at bedside on rounds.   Consults, Procedures, Significant Events   Consultants: None  Procedures:  None  Antimicrobials:  Anti-infectives (From admission, onward)   Start     Dose/Rate Route Frequency Ordered Stop   06/30/20 0900  azithromycin (ZITHROMAX) 500 mg in sodium chloride 0.9 % 250 mL IVPB        500 mg 250 mL/hr over 60 Minutes Intravenous Every 24 hours 06/29/20 2227 07/05/20 0859   06/30/20 0800  cefTRIAXone (ROCEPHIN) 2 g in sodium chloride 0.9 % 100 mL IVPB        2 g 200 mL/hr over 30 Minutes Intravenous Every 24 hours 06/29/20 2227  07/05/20 0759   06/29/20 2130  vancomycin (VANCOCIN) IVPB 1000 mg/200 mL premix        1,000 mg 200 mL/hr over 60 Minutes Intravenous  Once 06/29/20 2116 06/29/20 2228   06/29/20 2100  piperacillin-tazobactam (ZOSYN) IVPB 3.375 g        3.375 g 100 mL/hr over 30 Minutes Intravenous  Once 06/29/20 2054 06/29/20 2225         Objective   Vitals:   07/01/20 2004 07/02/20 0343 07/02/20 0751 07/02/20 1220  BP: 116/72 (!) 98/55 116/70 109/69  Pulse: 75 68 74 74  Resp: 16 17 18 18   Temp: (!) 97.5 F (36.4 C) 98.1 F (36.7 C) (!) 97.5 F (36.4 C) 97.7 F (36.5 C)  TempSrc: Oral Oral Oral Oral  SpO2:  99% 100% 98%  Weight:  58.2 kg  Height:        Intake/Output Summary (Last 24 hours) at 07/02/2020 1527 Last data filed at 07/02/2020 1450 Gross per 24 hour  Intake 240 ml  Output 0 ml  Net 240 ml   Filed Weights   06/29/20 1730 07/01/20 1039 07/02/20 0343  Weight: 46.3 kg 53.1 kg 58.2 kg    Physical Exam:  General exam: awake up in chair, no acute distress Respiratory system: CTAB, no wheezes, rales or rhonchi, normal respiratory effort. Cardiovascular system: normal S1/S2, RRR, systolic murmur, no pedal edema.   Gastrointestinal system: soft, non-tender, non-distended, active bowel sounds Neurologic: no gross focal motor deficits, A&Ox3, normal speech   Labs   Data Reviewed: I have personally reviewed following labs and imaging studies  CBC: Recent Labs  Lab 06/29/20 1725 06/30/20 0626 07/01/20 0451 07/02/20 0804  WBC 21.8* 17.7* 16.1* 15.1*  NEUTROABS 20.3*  --   --   --   HGB 10.3* 9.7* 9.4* 8.9*  HCT 31.0* 29.1* 27.7* 27.2*  MCV 92.5 92.7 91.4 93.2  PLT 128* 124* 126* 130*   Basic Metabolic Panel: Recent Labs  Lab 06/29/20 1725 06/30/20 0626 07/01/20 0451 07/02/20 0804  NA 134* 134* 133* 131*  K 2.9* 3.4* 2.9* 3.4*  CL 95* 98 102 103  CO2 28 27 23  20*  GLUCOSE 126* 100* 109* 111*  BUN 21 19 17 13   CREATININE 0.93 0.76 0.65 0.56  CALCIUM 9.0  8.2* 7.8* 7.8*  MG  --  2.1 2.0  --    GFR: Estimated Creatinine Clearance: 51.8 mL/min (by C-G formula based on SCr of 0.56 mg/dL). Liver Function Tests: Recent Labs  Lab 06/29/20 1725  AST 23  ALT 13  ALKPHOS 80  BILITOT 1.2  PROT 6.7  ALBUMIN 3.5   No results for input(s): LIPASE, AMYLASE in the last 168 hours. No results for input(s): AMMONIA in the last 168 hours. Coagulation Profile: Recent Labs  Lab 06/29/20 1725 06/30/20 0626 07/01/20 0451 07/02/20 0804  INR 2.0* 2.1* 1.5* 1.5*   Cardiac Enzymes: No results for input(s): CKTOTAL, CKMB, CKMBINDEX, TROPONINI in the last 168 hours. BNP (last 3 results) No results for input(s): PROBNP in the last 8760 hours. HbA1C: No results for input(s): HGBA1C in the last 72 hours. CBG: Recent Labs  Lab 06/29/20 1724  GLUCAP 107*   Lipid Profile: No results for input(s): CHOL, HDL, LDLCALC, TRIG, CHOLHDL, LDLDIRECT in the last 72 hours. Thyroid Function Tests: No results for input(s): TSH, T4TOTAL, FREET4, T3FREE, THYROIDAB in the last 72 hours. Anemia Panel: Recent Labs    07/02/20 0804  FOLATE 7.0  FERRITIN 278  TIBC 203*  IRON 13*  RETICCTPCT 1.7   Sepsis Labs: Recent Labs  Lab 06/29/20 1740 06/29/20 1945 06/29/20 2310 06/30/20 0256 06/30/20 0626 07/01/20 0451  PROCALCITON  --   --   --  11.82 12.22 5.96  LATICACIDVEN 2.3* 2.3* 2.6*  --   --  1.4    Recent Results (from the past 240 hour(s))  Culture, blood (Routine X 2) w Reflex to ID Panel     Status: None (Preliminary result)   Collection Time: 06/29/20  7:45 PM   Specimen: BLOOD  Result Value Ref Range Status   Specimen Description BLOOD RIGHT ANTECUBITAL  Final   Special Requests   Final    BOTTLES DRAWN AEROBIC AND ANAEROBIC Blood Culture adequate volume   Culture   Final    NO GROWTH 3 DAYS Performed at [facility], [address], [place], [place] [address]    Report Status PENDING  Incomplete  Urine Culture     Status:  Abnormal   Collection Time: 06/29/20  8:33 PM   Specimen: Urine, Random  Result Value Ref Range Status   Specimen Description   Final    URINE, RANDOM Performed at [facility], [address] [place], [place] [address]    Special Requests   Final    NONE Performed at [facility], [address] [place], [place] [address]    Culture 20,000 COLONIES/mL ESCHERICHIA COLI (A)  Final   Report Status 07/01/2020 FINAL  Final   Organism ID, Bacteria ESCHERICHIA COLI (A)  Final      Susceptibility   Escherichia coli - MIC*    AMPICILLIN 4 SENSITIVE Sensitive     CEFAZOLIN <=4 SENSITIVE Sensitive     CEFEPIME <=0.12 SENSITIVE Sensitive     CEFTRIAXONE <=0.25 SENSITIVE Sensitive     CIPROFLOXACIN <=0.25 SENSITIVE Sensitive     GENTAMICIN <=1 SENSITIVE Sensitive     IMIPENEM <=0.25 SENSITIVE Sensitive     NITROFURANTOIN <=16 SENSITIVE Sensitive     TRIMETH/SULFA <=20 SENSITIVE Sensitive     AMPICILLIN/SULBACTAM <=2 SENSITIVE Sensitive     PIP/TAZO <=4 SENSITIVE Sensitive     * 20,000 COLONIES/mL ESCHERICHIA COLI  Culture, blood (Routine X 2) w Reflex to ID Panel     Status: None (Preliminary result)   Collection Time: 06/29/20  8:35 PM   Specimen: BLOOD  Result Value Ref Range Status   Specimen Description BLOOD LEFT ANTECUBITAL  Final   Special Requests   Final    BOTTLES DRAWN AEROBIC AND ANAEROBIC Blood Culture results may not be optimal due to an excessive volume of blood received in culture bottles   Culture   Final    NO GROWTH 3 DAYS Performed at [facility], [address] [place], [place] [address]    Report Status PENDING  Incomplete  Resp Panel by RT-PCR (Flu A&B, Covid) Nasopharyngeal Swab     Status: None   Collection Time: 06/29/20 11:13 PM   Specimen: Nasopharyngeal Swab; Nasopharyngeal(NP) swabs in vial transport medium  Result Value Ref Range Status   SARS Coronavirus 2 by RT PCR NEGATIVE NEGATIVE Final    Comment:  (NOTE) SARS-CoV-2 target nucleic acids are NOT DETECTED.  The SARS-CoV-2 RNA is generally detectable in upper respiratory specimens during the acute phase of infection. The lowest concentration of SARS-CoV-2 viral copies this assay can detect is 138 copies/mL. A negative result does not preclude SARS-Cov-2 infection and should not be used as the sole basis for treatment or other patient management decisions. A negative result may occur with  improper specimen collection/handling, submission of specimen other than nasopharyngeal swab, presence of viral mutation(s) within the areas targeted by this assay, and inadequate number of viral copies(<138 copies/mL). A negative result must be combined with clinical observations, patient history, and epidemiological information. The expected result is Negative.  Fact Sheet for Patients:  BloggerCourse.com  Fact Sheet for Healthcare Providers:  SeriousBroker.it  This test is no t yet approved or cleared by the [place] FDA and  has been authorized for detection and/or diagnosis of SARS-CoV-2 by FDA under an Emergency Use Authorization (EUA). This EUA will remain  in effect (meaning this test can be used) for the duration of the COVID-19 declaration under Section 564(b)(1) of the Act, 21 U.S.C.section 360bbb-3(b)(1), unless the authorization is terminated  or revoked sooner.  Influenza A by PCR NEGATIVE NEGATIVE Final   Influenza B by PCR NEGATIVE NEGATIVE Final    Comment: (NOTE) The Xpert Xpress SARS-CoV-2/FLU/RSV plus assay is intended as an aid in the diagnosis of influenza from Nasopharyngeal swab specimens and should not be used as a sole basis for treatment. Nasal washings and aspirates are unacceptable for Xpert Xpress SARS-CoV-2/FLU/RSV testing.  Fact Sheet for Patients: BloggerCourse.com  Fact Sheet for Healthcare  Providers: SeriousBroker.it  This test is not yet approved or cleared by the [place] FDA and has been authorized for detection and/or diagnosis of SARS-CoV-2 by FDA under an Emergency Use Authorization (EUA). This EUA will remain in effect (meaning this test can be used) for the duration of the COVID-19 declaration under Section 564(b)(1) of the Act, 21 U.S.C. section 360bbb-3(b)(1), unless the authorization is terminated or revoked.  Performed at [facility], [address] [place], [place] [address]       Imaging Studies   No results found.   Medications   Scheduled Meds: . atenolol  100 mg Oral Daily  . chlorzoxazone  500 mg Oral Daily  . feeding supplement  237 mL Oral BID BM  . [START ON 07/03/2020] multivitamin with minerals  1 tablet Oral Daily  . nortriptyline  50 mg Oral QHS  . rOPINIRole  0.75 mg Oral QHS   Continuous Infusions: . azithromycin 500 mg (07/02/20 1021)  . cefTRIAXone (ROCEPHIN)  IV 2 g (07/02/20 0926)       LOS: 3 days    Time spent: 30 minutes with >50% spent in coordination of care and at bedside.    Pennie Banter, DO [facility]  07/02/2020, 3:27 PM    If 7PM-7AM, please contact night-coverage. How to contact the [facility] Attending or Consulting provider 7A - 7P or covering provider during after hours 7P -7A, for this patient?    1. Check the care team in [facility] and look for a) attending/consulting TRH provider listed and b) the [facility] team listed 2. Log into [email or web address] and use [facility] universal password to access. If you do not have the password, please contact the hospital operator. 3. Locate the [facility] provider you are looking for under [facility] and page to a number that you can be directly reached. 4. If you still have difficulty reaching the provider, please page the [facility] (Director on Call) for the Hospitalists listed on amion for assistance.

## 2020-07-02 NOTE — Progress Notes (Signed)
ANTICOAGULATION CONSULT NOTE -  Pharmacy Consult for Heparin and Warfarin  Indication: mechanical aortic valve  No Known Allergies  Patient Measurements: Height: 5\' 2"  (157.5 cm) Weight: 58.2 kg (128 lb 4.8 oz) IBW/kg (Calculated) : 50.1 HEPARIN DW (KG): 53.1  Vital Signs: Temp: 97.6 F (36.4 C) (12/24 1549) Temp Source: Oral (12/24 1549) BP: 116/68 (12/24 1549) Pulse Rate: 76 (12/24 1549)  Labs: Recent Labs    06/30/20 0626 06/30/20 1508 07/01/20 0451 07/01/20 1045 07/01/20 2257 07/02/20 0804 07/02/20 1627  HGB 9.7*  --  9.4*  --   --  8.9* 9.0*  HCT 29.1*  --  27.7*  --   --  27.2* 28.0*  PLT 124*  --  126*  --   --  130*  --   LABPROT 23.1*  --  17.5*  --   --  17.2*  --   INR 2.1*  --  1.5*  --   --  1.5*  --   HEPARINUNFRC  --    < >  --    < > 0.12* 0.41 <0.10*  CREATININE 0.76  --  0.65  --   --  0.56  --    < > = values in this interval not displayed.    Estimated Creatinine Clearance: 51.8 mL/min (by C-G formula based on SCr of 0.56 mg/dL).  Medical History: Past Medical History:  Diagnosis Date  . Hypertension    Medications:  Medications Prior to Admission  Medication Sig Dispense Refill Last Dose  . atenolol (TENORMIN) 100 MG tablet Take 100 mg by mouth daily.   Past Week at Unknown time  . butalbital-acetaminophen-caffeine (FIORICET WITH CODEINE) 50-325-40-30 MG capsule Take 1 capsule by mouth 4 (four) times daily as needed.   prn at prn  . Calcium Carbonate-Vitamin D 600-400 MG-UNIT tablet Take 1 tablet by mouth in the morning and at bedtime.   Past Week at Unknown time  . celecoxib (CELEBREX) 100 MG capsule Take 100 mg by mouth 2 (two) times daily.   Past Week at Unknown time  . chlorzoxazone (PARAFON) 500 MG tablet Take 500 mg by mouth daily.   Past Week at Unknown time  . furosemide (LASIX) 20 MG tablet Take 20 mg by mouth daily.   Past Week at Unknown time  . losartan (COZAAR) 100 MG tablet Take 100 mg by mouth daily.   Past Week at Unknown  time  . nortriptyline (PAMELOR) 50 MG capsule Take 50 mg by mouth at bedtime.   Past Week at Unknown time  . rOPINIRole (REQUIP) 0.25 MG tablet Take 0.75 mg by mouth at bedtime.   Past Week at Unknown time  . traMADol (ULTRAM) 50 MG tablet Take 50 mg by mouth every 8 (eight) hours as needed.   prn at prn  . warfarin (COUMADIN) 5 MG tablet Take 5 mg by mouth daily.   Past Week at Unknown time   Assessment: Pt on Warfarin PTA for mechanical valve. INR goal 2.5-3.5 per outpatient cardiology notes. Patient takes 5 mg PO daily (35 mg total weekly dose). INR was 2.0 (subtherapeutic) on admission. Warfarin has been held and heparin was initiated as patient might have needed surgery. Pharmacy has been consulted to resume warfarin as there are no plans for surgery at this time.  CBC stable - Hgb 10.3>9.7>9.4>8.9, plts 128>124>126>130  DD Interactions: Zosyn 3.375 g x 1 on 12/21 Ceftriaxone 2 g daily + Azithromycin 500 mg daily (12/22>> Ropinirole (12/22>>  Warfarin: Goal INR:  2.5-3.5  Date INR Warfarin Dose 12/21 2.0 Held (unsure when patient took last warfarin dose) 12/22 2.1 Held (heparin gtt started) 12/23 1.5 7.5 mg (1.5 x home dose) given patient subtherapeutic on admission 12/24 1.5 7.5 mg   Plan:  Give warfarin 7.5 mg PO x 1 given subtherapeutic INR on admission and doses held x 2 days.  Pt was subtherapeutic on arrival with 5 mg PO daily - if discharging, may consider 6 mg daily.   Daily INR and CBC  Heparin: 12/22 1508 HL <0.10, subtherapeutic 12/23 0041 HL 0.29, barely subtherapeutic 12/23 1045 HL < 0.10, subtherapeutic - confirmed with nurse no stops or line issues 12/23 2257 HL 0.12, subtherapeutic. 12/24 0804 HL 0.41, therapeutic 12/24 1627 HL < 0.1, subtherapeutic  Goal of Therapy:  Heparin level 0.3-0.7 units/ml Monitor platelets by anticoagulation protocol: Yes   Plan:  12/24:  HL @ 1627 :  < 0.1 Heparin drip d/c'd @ 1526 due to positive blood in stool.  GI has  been consulted.   Will f/u plans for anticoag on 12/25.  Husain Costabile D, PharmD 07/02/2020 5:53 PM

## 2020-07-03 DIAGNOSIS — D649 Anemia, unspecified: Secondary | ICD-10-CM

## 2020-07-03 DIAGNOSIS — R6520 Severe sepsis without septic shock: Secondary | ICD-10-CM | POA: Diagnosis not present

## 2020-07-03 DIAGNOSIS — I5032 Chronic diastolic (congestive) heart failure: Secondary | ICD-10-CM | POA: Diagnosis not present

## 2020-07-03 DIAGNOSIS — J189 Pneumonia, unspecified organism: Secondary | ICD-10-CM | POA: Diagnosis not present

## 2020-07-03 DIAGNOSIS — A419 Sepsis, unspecified organism: Secondary | ICD-10-CM | POA: Diagnosis not present

## 2020-07-03 LAB — CBC
HCT: 23.4 % — ABNORMAL LOW (ref 36.0–46.0)
Hemoglobin: 8.1 g/dL — ABNORMAL LOW (ref 12.0–15.0)
MCH: 31.3 pg (ref 26.0–34.0)
MCHC: 34.6 g/dL (ref 30.0–36.0)
MCV: 90.3 fL (ref 80.0–100.0)
Platelets: 136 10*3/uL — ABNORMAL LOW (ref 150–400)
RBC: 2.59 MIL/uL — ABNORMAL LOW (ref 3.87–5.11)
RDW: 15.3 % (ref 11.5–15.5)
WBC: 11.9 10*3/uL — ABNORMAL HIGH (ref 4.0–10.5)
nRBC: 0 % (ref 0.0–0.2)

## 2020-07-03 LAB — HEMOGLOBIN AND HEMATOCRIT, BLOOD
HCT: 24 % — ABNORMAL LOW (ref 36.0–46.0)
HCT: 27.4 % — ABNORMAL LOW (ref 36.0–46.0)
HCT: 29.1 % — ABNORMAL LOW (ref 36.0–46.0)
Hemoglobin: 7.9 g/dL — ABNORMAL LOW (ref 12.0–15.0)
Hemoglobin: 8.9 g/dL — ABNORMAL LOW (ref 12.0–15.0)
Hemoglobin: 9.1 g/dL — ABNORMAL LOW (ref 12.0–15.0)

## 2020-07-03 LAB — COMPREHENSIVE METABOLIC PANEL
ALT: 14 U/L (ref 0–44)
AST: 19 U/L (ref 15–41)
Albumin: 2.5 g/dL — ABNORMAL LOW (ref 3.5–5.0)
Alkaline Phosphatase: 53 U/L (ref 38–126)
Anion gap: 7 (ref 5–15)
BUN: 9 mg/dL (ref 8–23)
CO2: 18 mmol/L — ABNORMAL LOW (ref 22–32)
Calcium: 7.7 mg/dL — ABNORMAL LOW (ref 8.9–10.3)
Chloride: 105 mmol/L (ref 98–111)
Creatinine, Ser: 0.61 mg/dL (ref 0.44–1.00)
GFR, Estimated: >60 mL/min (ref >60)
Glucose, Bld: 119 mg/dL — ABNORMAL HIGH (ref 70–99)
Potassium: 3.4 mmol/L — ABNORMAL LOW (ref 3.5–5.1)
Sodium: 130 mmol/L — ABNORMAL LOW (ref 135–145)
Total Bilirubin: 0.5 mg/dL (ref 0.3–1.2)
Total Protein: 5.4 g/dL — ABNORMAL LOW (ref 6.5–8.1)

## 2020-07-03 MED ORDER — POLYETHYLENE GLYCOL 3350 17 GM/SCOOP PO POWD
1.0000 | Freq: Once | ORAL | Status: AC
  Administered 2020-07-03: 16:00:00 255 g via ORAL
  Filled 2020-07-03: qty 255

## 2020-07-03 MED ORDER — MAGNESIUM CITRATE PO SOLN
1.0000 | Freq: Once | ORAL | Status: AC
  Administered 2020-07-03: 10:00:00 1 via ORAL
  Filled 2020-07-03: qty 296

## 2020-07-03 MED ORDER — SODIUM CHLORIDE 0.9 % IV SOLN: INTRAVENOUS | Status: DC

## 2020-07-03 MED ORDER — POTASSIUM CHLORIDE 10 MEQ/100ML IV SOLN
10.0000 meq | INTRAVENOUS | Status: AC
Start: 2020-07-03 — End: 2020-07-03
  Administered 2020-07-03 (×4): 10 meq via INTRAVENOUS
  Filled 2020-07-03 (×4): qty 100

## 2020-07-03 NOTE — Consult Note (Signed)
Cephas Darby, MD [address]  [place]  [place], [place] [address]  Main: [phone]  Fax: [phone] Pager: [phone]   Consultation  Referring Provider:     No ref. provider found Primary Care Physician:  Baxter Hire, MD Primary Gastroenterologist: Althia Forts      Reason for Consultation:     Anemia  Date of Admission:  06/29/2020 Date of Consultation:  07/03/2020         HPI:   Shelby Werner is a 69 y.o. female with history of aortic valve disease s/p replacement in 1991 on warfarin, s/p pacemaker and AICD, history of CVA, dementia, diastolic heart failure who is admitted on 12/21 secondary to sepsis from pneumonia.  Patient is being treated for pneumonia, her leukocytosis has almost resolved.  She is hemodynamically stable, her respiratory status has improved, not on supplemental oxygen.  She has been tolerating p.o. well.  Her anticoagulation has been restarted with heparin bridge and Coumadin.  However, her hemoglobin has been noted to be gradually declining since admission, dropped from 10.3 to 8.1 today.  Therefore, her warfarin and heparin have been discontinued.  Her INR is 1.5.  Her hemoglobin was 11.1 on 05/11/20.  Iron studies revealed normal ferritin, normal folate and B12 levels.  Her BUN and creatinine are normal.  No evidence of chronic liver disease.  Has mild thrombocytopenia  When I went to see the patient, she was drowsy but arousable and irritable.  She denied any abdominal pain, nausea or vomiting.  I discussed with her about endoscopy procedures, she wanted me to call her husband and discuss about the procedures Patient denies smoking or alcohol use  NSAIDs: None  Antiplts/Anticoagulants/Anti thrombotics: Warfarin and Coumadin  GI Procedures: None  Past Medical History:  Diagnosis Date  . Hypertension     Past Surgical History:  Procedure Laterality Date  . ABDOMINAL HYSTERECTOMY    . AUGMENTATION MAMMAPLASTY Bilateral     breast implants    Current Facility-Administered Medications:  .  0.9 %  sodium chloride infusion, , Intravenous, Continuous, Nicole Kindred A, DO .  0.9 %  sodium chloride infusion, , Intravenous, Continuous, Syann Cupples, Tally Due, MD .  acetaminophen (TYLENOL) tablet 650 mg, 650 mg, Oral, Q6H PRN **OR** acetaminophen (TYLENOL) suppository 650 mg, 650 mg, Rectal, Q6H PRN, Opyd, Timothy S, MD .  atenolol (TENORMIN) tablet 100 mg, 100 mg, Oral, Daily, Opyd, Ilene Qua, MD, 100 mg at 07/02/20 0926 .  azithromycin (ZITHROMAX) 500 mg in sodium chloride 0.9 % 250 mL IVPB, 500 mg, Intravenous, Q24H, Opyd, Timothy S, MD, Last Rate: 250 mL/hr at 07/02/20 1021, 500 mg at 07/02/20 1021 .  cefTRIAXone (ROCEPHIN) 2 g in sodium chloride 0.9 % 100 mL IVPB, 2 g, Intravenous, Q24H, Opyd, Ilene Qua, MD, Last Rate: 200 mL/hr at 07/02/20 0926, 2 g at 07/02/20 0926 .  chlorzoxazone (PARAFON) tablet 500 mg, 500 mg, Oral, Daily, Nicole Kindred A, DO, 500 mg at 07/02/20 0926 .  feeding supplement (ENSURE ENLIVE / ENSURE PLUS) liquid 237 mL, 237 mL, Oral, BID BM, Arbutus Ped, Kelly A, DO .  labetalol (NORMODYNE) injection 10 mg, 10 mg, Intravenous, Q10 min PRN, Opyd, Timothy S, MD .  magnesium citrate solution 1 Bottle, 1 Bottle, Oral, Once, Saniyyah Elster, Tally Due, MD .  multivitamin with minerals tablet 1 tablet, 1 tablet, Oral, Daily, Nicole Kindred A, DO .  nortriptyline (PAMELOR) capsule 50 mg, 50 mg, Oral, QHS, Griffith, Kelly A, DO, 50 mg at  07/02/20 2119 .  ondansetron (ZOFRAN) tablet 4 mg, 4 mg, Oral, Q6H PRN **OR** ondansetron (ZOFRAN) injection 4 mg, 4 mg, Intravenous, Q6H PRN, Opyd, Timothy S, MD .  pantoprazole (PROTONIX) injection 40 mg, 40 mg, Intravenous, Q12H, Nicole Kindred A, DO, 40 mg at 07/02/20 2119 .  polyethylene glycol powder (GLYCOLAX/MIRALAX) container 255 g, 1 Container, Oral, Once, Leann Mayweather, Tally Due, MD .  potassium chloride 10 mEq in 100 mL IVPB, 10 mEq, Intravenous, Q1 Hr x 4, Griffith, Kelly  A, DO .  rOPINIRole (REQUIP) tablet 0.75 mg, 0.75 mg, Oral, QHS, Nicole Kindred A, DO, 0.75 mg at 07/02/20 2119 .  senna-docusate (Senokot-S) tablet 1 tablet, 1 tablet, Oral, QHS PRN, Opyd, Ilene Qua, MD .  traZODone (DESYREL) tablet 50 mg, 50 mg, Oral, QHS PRN, Nicole Kindred A, DO, 50 mg at 07/03/20 0042   Family History  Problem Relation Age of Onset  . Breast cancer Paternal Aunt   . Breast cancer Paternal Aunt      Social History   Tobacco Use  . Smoking status: Former Research scientist (life sciences)  . Smokeless tobacco: Never Used  Substance Use Topics  . Alcohol use: Not Currently  . Drug use: Never    Allergies as of 06/29/2020  . (No Known Allergies)    Review of Systems:    All systems reviewed and negative except where noted in HPI.   Physical Exam:  Vital signs in last 24 hours: Temp:  [97.4 F (36.3 C)-98.6 F (37 C)] 98.4 F (36.9 C) (12/25 0756) Pulse Rate:  [74-79] 78 (12/25 0756) Resp:  [17-18] 17 (12/25 0756) BP: (106-116)/(60-69) 106/60 (12/25 0756) SpO2:  [97 %-100 %] 100 % (12/25 0756) Weight:  [57 kg] 57 kg (12/25 0339) Last BM Date: 07/02/20 General: Lethargic, irritable, cooperative in NAD Head:  Normocephalic and atraumatic. Eyes:   No icterus.   Conjunctiva pink. PERRLA. Ears:  Normal auditory acuity. Neck:  Supple; no masses or thyroidomegaly Lungs: Respirations even and unlabored. Lungs clear to auscultation bilaterally.   No wheezes, crackles, or rhonchi.  Heart:  Regular rate and rhythm;  Without murmur, clicks, rubs or gallops Abdomen:  Soft, nondistended, nontender. Normal bowel sounds. No appreciable masses or hepatomegaly.  No rebound or guarding.  Rectal:  Not performed. Msk:  Symmetrical without gross deformities.  Strength generalized weakness Extremities:  Without edema, cyanosis or clubbing. Neurologic:  Alert and oriented x3;  grossly normal neurologically. Skin:  Intact without significant lesions or rashes.  LAB RESULTS: CBC Latest Ref Rng &  Units 07/03/2020 07/02/2020 07/02/2020  WBC 4.0 - 10.5 K/uL 11.9(H) - -  Hemoglobin 12.0 - 15.0 g/dL 8.1(L) 9.2(L) 9.0(L)  Hematocrit 36.0 - 46.0 % 23.4(L) 28.1(L) 28.0(L)  Platelets 150 - 400 K/uL 136(L) - -    BMET BMP Latest Ref Rng & Units 07/03/2020 07/02/2020 07/01/2020  Glucose 70 - 99 mg/dL 119(H) 111(H) 109(H)  BUN 8 - 23 mg/dL 9 13 17   Creatinine 0.44 - 1.00 mg/dL 0.61 0.56 0.65  Sodium 135 - 145 mmol/L 130(L) 131(L) 133(L)  Potassium 3.5 - 5.1 mmol/L 3.4(L) 3.4(L) 2.9(L)  Chloride 98 - 111 mmol/L 105 103 102  CO2 22 - 32 mmol/L 18(L) 20(L) 23  Calcium 8.9 - 10.3 mg/dL 7.7(L) 7.8(L) 7.8(L)    LFT Hepatic Function Latest Ref Rng & Units 07/03/2020 06/29/2020  Total Protein 6.5 - 8.1 g/dL 5.4(L) 6.7  Albumin 3.5 - 5.0 g/dL 2.5(L) 3.5  AST 15 - 41 U/L 19 23  ALT 0 -  44 U/L 14 13  Alk Phosphatase 38 - 126 U/L 53 80  Total Bilirubin 0.3 - 1.2 mg/dL 0.5 1.2     STUDIES: No results found.    Impression / Plan:   Shelby Werner is a 69 y.o. female with history of CHF, aortic valve replacement on long-term Coumadin, s/p AICD and pacemaker, history of CVA, dementia who is admitted with sepsis secondary to pneumonia, GI is consulted for progressively worsening anemia  Anemia with no active GI bleed FOBT is not recommended in inpatient setting and does not have clinical utility Iron studies are consistent with anemia of chronic disease.  No evidence of B12 or folate deficiency With no evidence of active GI bleed, less likely GI source as etiology.  Slow blood loss from AVMs is a possibility given the gradual decline in hemoglobin.  Also, since the patient has to go back on long-term anticoagulation, I have discussed with patient's husband about performing upper endoscopy as well as colonoscopy once cleared by cardiology, to evaluate for any possible source of the GI tract If EGD and colonoscopy are unremarkable, recommend video capsule endoscopy Tentatively planned for EGD  and colonoscopy tomorrow, pending cardiology clearance Clear liquid diet today Bowel prep ordered, n.p.o. past midnight  Thank you for involving me in the care of this patient.      LOS: 4 days   Sherri Sear, MD  07/03/2020, 9:32 AM   Note: This dictation was prepared with Dragon dictation along with smaller phrase technology. Any transcriptional errors that result from this process are unintentional.

## 2020-07-03 NOTE — Progress Notes (Addendum)
Pt completed prep at 2010. Pt unable to sign consent for her EGD and colonoscopy in the morning due to confusion. Shelby Werner husband did confirmed that all pros and cons were discussed with him and wife by MD and no question at this time. Consent form was obtained through Mr. Shelby Werner via phone by two nursing staff. Will notify incoming shift. Will continue to monitor.

## 2020-07-03 NOTE — Progress Notes (Addendum)
PROGRESS NOTE    Shelby Werner   HQI:[id number]  DOB: 18-Sep-1950  PCP: Gracelyn NurseJohnston, John D, MD    DOA: 06/29/2020 LOS: 4   Brief Narrative    Shelby Werner is a 69 y.o. 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  Patient found to have RUL opacity on chest xray and right hilar/mediastinal adenopathy versus mass.    Patient admitted for further management of severe sepsis due to pneumonia.    Discussed aortic dissection finding with [facility] cardiothoracic surgeon Dr. Randel BooksZwischenberger who felt this to be an incidental finding and likely chronic.  Given patient asymptomatic, hemodynamically stable and currently with pneumonia, [facility].  She was provided pt's husband contact information and her clinic is to contact him to set up outpatient appointment for after patient recovered from pneumonia.       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   Hemoglobin has been trending down from 10.3 on admission to 8.9 this morning.   FOBT is positive 12/24. --Consult GI --Cardiac clearance for endoscopy is pending --Stopped warfarin and heparin drip for now --IV Protonix twice daily --Clear liquid diet for now pending GIs plan --Anemia panel shows iron deficiency, normal B12 and folate  Severe Sepsis 2/2  community-acquired PNA - sepsis POA with tachycardia, tachypnea, leukocytosis, in setting of PNA. Elevated lactate and acute encephalopathy reflects organ dysfunction.   Lactic acidosis has resolved and sepsis physiology improved. --Continue Rocephin and Zithromax - to end 12/26 --Follow cultures -negative to date  Urinary Tract Infection - pt poor historian so unclear if symptomatic.  Urine cx growing pansensitive E. coli.   --On abx for PNA as above  Subtherapeutic INR -  Hx of aortic valve replacement  Has been on heparin gtt which was started on admission after finding of aortic dissection in case of surgical intervention which is deferred until patient recovered from infections and will be evaluated as outpatient. Resumed on warfarin last night, however patient now with GI bleeding and worsening anemia. 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 Transfer to [facility] was pursued, but deferred due to infection and CT surgery felt was incidental finding without urgent need for intervention.  Pt may not be great candidate. [facility] CT surgeon's office to contact husband to arrange outpatient follow up.  --on heparin which was recommended by [facility] CTS last night.  Warfarin held. --target SBP <130 and HR in 60's if possible   Acute metabolic encephalopathy due to infection - 2 day hx confusion over baseline per husband.  At baseline 12/22 on AM rounds per husband.  CT head was negative for acute findings.  Monitor.  Delirium precautions. --resume home nortriptyline, Parafon, and Requip which were initially held  Chronic diastolic CHF - hypovolemic on admission.   On IV fluids.  Monitor volume status closely.   --continue beta-blocker and ARB    Normocytic anemia - no evidence of bleeding.  Monitor CBC.  Anemia panel with AM labs.   Hypokalemia - K 2.9 on adm, replaced.   Monitor BMP    Dementia - no behavioral issues.  Appears mild.   12/22 - at baseline mental status per husband. --Delirium precautions    Thrombocytopenia - Plt 128k on admission with intermittent lows in past.  Suspect due to sepsis.  Follow CBC.   DVT prophylaxis: scd's for now   Diet:  Diet Orders (From admission, onward)     Start     Ordered   07/04/20 0001  Diet NPO time specified Except for: Sips with Meds, Ice Chips  Diet effective midnight       Question Answer Comment  Except for Sips with Meds   Except for Ice Chips      07/03/20 0912   07/02/20 1536  Diet clear liquid Room service appropriate? Yes; Fluid consistency: Thin  Diet effective now       Question Answer Comment  Room service appropriate? Yes   Fluid consistency: Thin      07/02/20 1535              Code Status: Full Code    Subjective 07/03/20    Pt seen this AM up in chair.  She's hungry but understanding of reason for NPO.  Denies abdominal pain, nausea/vomiting, cough, fever/chills or other acute complaints.    Disposition Plan & Communication   Status is: Inpatient  Inpatient status remains appropriate due to severity of illness.  Patient has acute blood loss anemia with GI bleeding.  Dispo: The patient is from: home              Anticipated d/c is to: home              Anticipated d/c date is: 3 days              Patient currently is not medically stable for d/c.  Family Communication: husband at bedside on rounds.   Consults, Procedures, Significant Events   Consultants: None  Procedures:  None  Antimicrobials:  Anti-infectives (From admission, onward)    Start     Dose/Rate Route Frequency Ordered Stop   06/30/20 0900  azithromycin (ZITHROMAX) 500 mg in sodium chloride 0.9 % 250 mL IVPB        500 mg 250 mL/hr over 60 Minutes Intravenous Every 24 hours 06/29/20 2227 07/05/20 0859   06/30/20 0800  cefTRIAXone (ROCEPHIN) 2 g in sodium chloride 0.9 % 100 mL IVPB        2  g 200 mL/hr over 30 Minutes Intravenous Every 24 hours 06/29/20 2227 07/05/20 0759   06/29/20 2130  vancomycin (VANCOCIN) IVPB 1000 mg/200 mL premix        1,000 mg 200 mL/hr over 60 Minutes Intravenous  Once 06/29/20 2116 06/29/20 2228   06/29/20 2100  piperacillin-tazobactam (ZOSYN) IVPB 3.375 g        3.375 g 100 mL/hr over 30 Minutes Intravenous  Once 06/29/20 2054 06/29/20 2225          Objective   Vitals:   07/02/20 2102 07/03/20 0339 07/03/20 0756 07/03/20 1132  BP: 108/60 106/64 106/60 105/71  Pulse: 79 77 78 80  Resp: 17 17 17 17   Temp: (!) 97.4 F (36.3  C) 98.6 F (37 C) 98.4 F (36.9 C) 98 F (36.7 C)  TempSrc: Oral Oral Oral Oral  SpO2:  98% 100% 99%  Weight:  57 kg    Height:        Intake/Output Summary (Last 24 hours) at 07/03/2020 1542 Last data filed at 07/03/2020 1012 Gross per 24 hour  Intake 0 ml  Output --  Net 0 ml   Filed Weights   07/01/20 1039 07/02/20 0343 07/03/20 0339  Weight: 53.1 kg 58.2 kg 57 kg    Physical Exam:  General exam: awake, up in chair, no acute distress Respiratory system: CTAB, normal respiratory effort. Cardiovascular system: normal S1/S2, RRR, systolic murmur, no pedal edema.   Neurologic: no gross focal motor deficits, A&Ox3, normal speech Gastrointestinal: soft, non-tender   Labs   Data Reviewed: I have personally reviewed following labs and imaging studies  CBC: Recent Labs  Lab 06/29/20 1725 06/30/20 0626 07/01/20 0451 07/02/20 0804 07/02/20 1627 07/02/20 2143 07/03/20 0547 07/03/20 0958 07/03/20 1528  WBC 21.8* 17.7* 16.1* 15.1*  --   --  11.9*  --   --   NEUTROABS 20.3*  --   --   --   --   --   --   --   --   HGB 10.3* 9.7* 9.4* 8.9* 9.0* 9.2* 8.1* 7.9* 8.9*  HCT 31.0* 29.1* 27.7* 27.2* 28.0* 28.1* 23.4* 24.0* 27.4*  MCV 92.5 92.7 91.4 93.2  --   --  90.3  --   --   PLT 128* 124* 126* 130*  --   --  136*  --   --    Basic Metabolic Panel: Recent Labs  Lab 06/29/20 1725 06/30/20 0626  07/01/20 0451 07/02/20 0804 07/03/20 0547  NA 134* 134* 133* 131* 130*  K 2.9* 3.4* 2.9* 3.4* 3.4*  CL 95* 98 102 103 105  CO2 28 27 23  20* 18*  GLUCOSE 126* 100* 109* 111* 119*  BUN 21 19 17 13 9   CREATININE 0.93 0.76 0.65 0.56 0.61  CALCIUM 9.0 8.2* 7.8* 7.8* 7.7*  MG  --  2.1 2.0  --   --    GFR: Estimated Creatinine Clearance: 51.8 mL/min (by C-G formula based on SCr of 0.61 mg/dL). Liver Function Tests: Recent Labs  Lab 06/29/20 1725 07/03/20 0547  AST 23 19  ALT 13 14  ALKPHOS 80 53  BILITOT 1.2 0.5  PROT 6.7 5.4*  ALBUMIN 3.5 2.5*   No results for input(s): LIPASE, AMYLASE in the last 168 hours. No results for input(s): AMMONIA in the last 168 hours. Coagulation Profile: Recent Labs  Lab 06/29/20 1725 06/30/20 0626 07/01/20 0451 07/02/20 0804  INR 2.0* 2.1* 1.5* 1.5*   Cardiac Enzymes: No results for input(s): CKTOTAL, CKMB, CKMBINDEX, TROPONINI in the last 168 hours. BNP (last 3 results) No results for input(s): PROBNP in the last 8760 hours. HbA1C: No results for input(s): HGBA1C in the last 72 hours. CBG: Recent Labs  Lab 06/29/20 1724  GLUCAP 107*   Lipid Profile: No results for input(s): CHOL, HDL, LDLCALC, TRIG, CHOLHDL, LDLDIRECT in the last 72 hours. Thyroid Function Tests: No results for input(s): TSH, T4TOTAL, FREET4, T3FREE, THYROIDAB in the last 72 hours. Anemia Panel: Recent Labs    07/02/20 0804  VITAMINB12 606  FOLATE 7.0  FERRITIN 278  TIBC 203*  IRON 13*  RETICCTPCT 1.7   Sepsis Labs: Recent Labs  Lab 06/29/20 1740 06/29/20 1945 06/29/20 2310 06/30/20 0256 06/30/20 0626 07/01/20 0451  PROCALCITON  --   --   --  11.82 12.22 5.96  LATICACIDVEN 2.3* 2.3* 2.6*  --   --  1.4    Recent Results (from the past 240 hour(s))  Culture, blood (Routine X 2) w Reflex to ID Panel     Status: None (Preliminary result)   Collection Time: 06/29/20  7:45 PM   Specimen: BLOOD  Result Value Ref Range Status   Specimen Description  BLOOD RIGHT ANTECUBITAL  Final   Special Requests   Final    BOTTLES DRAWN AEROBIC AND ANAEROBIC Blood Culture adequate volume   Culture   Final    NO GROWTH 4 DAYS Performed at [facility], [address] [place], [place] [address]    Report Status PENDING  Incomplete  Urine Culture     Status: Abnormal   Collection Time: 06/29/20  8:33 PM   Specimen: Urine, Random  Result Value Ref Range Status   Specimen Description   Final    URINE, RANDOM Performed at [facility], [address], [place], [place] [address]    Special Requests   Final    NONE Performed at [facility], [address], [place], [place] [address]    Culture 20,000 COLONIES/mL ESCHERICHIA COLI (A)  Final   Report Status 07/01/2020 FINAL  Final   Organism ID, Bacteria ESCHERICHIA COLI (A)  Final      Susceptibility   Escherichia coli - MIC*    AMPICILLIN 4 SENSITIVE Sensitive     CEFAZOLIN <=4 SENSITIVE Sensitive     CEFEPIME <=0.12 SENSITIVE Sensitive     CEFTRIAXONE <=0.25 SENSITIVE Sensitive     CIPROFLOXACIN <=0.25 SENSITIVE Sensitive     GENTAMICIN <=1 SENSITIVE Sensitive     IMIPENEM <=0.25 SENSITIVE Sensitive     NITROFURANTOIN <=16 SENSITIVE Sensitive     TRIMETH/SULFA <=20 SENSITIVE Sensitive     AMPICILLIN/SULBACTAM <=2 SENSITIVE Sensitive     PIP/TAZO <=4 SENSITIVE Sensitive     * 20,000 COLONIES/mL ESCHERICHIA COLI  Culture, blood (Routine X 2) w Reflex to ID Panel     Status: None (Preliminary result)   Collection Time: 06/29/20  8:35 PM   Specimen: BLOOD  Result Value Ref Range Status   Specimen Description BLOOD LEFT ANTECUBITAL  Final   Special Requests   Final    BOTTLES DRAWN AEROBIC AND ANAEROBIC Blood Culture results may not be optimal due to an excessive volume of blood received in culture bottles   Culture   Final    NO GROWTH 4 DAYS Performed at [facility], [address] [place], [place] [address]    Report Status PENDING  Incomplete   Resp Panel by RT-PCR (Flu A&B, Covid) Nasopharyngeal Swab     Status: None   Collection Time: 06/29/20 11:13 PM   Specimen: Nasopharyngeal Swab; Nasopharyngeal(NP) swabs in vial transport medium  Result Value Ref Range Status   SARS Coronavirus 2 by RT PCR NEGATIVE NEGATIVE Final    Comment: (NOTE) SARS-CoV-2 target nucleic acids are NOT DETECTED.  The SARS-CoV-2 RNA is generally detectable in upper respiratory specimens during the acute phase of infection. The lowest concentration of SARS-CoV-2 viral copies this assay can detect is 138 copies/mL. A negative result does not preclude SARS-Cov-2 infection and should not be used as the sole basis for treatment or other patient management decisions. A negative result may occur with  improper specimen collection/handling, submission of specimen other than nasopharyngeal swab, presence of viral mutation(s) within  the areas targeted by this assay, and inadequate number of viral copies(<138 copies/mL). A negative result must be combined with clinical observations, patient history, and epidemiological information. The expected result is Negative.  Fact Sheet for Patients:  BloggerCourse.com  Fact Sheet for Healthcare Providers:  SeriousBroker.it  This test is no t yet approved or cleared by the [place] FDA and  has been authorized for detection and/or diagnosis of SARS-CoV-2 by FDA under an Emergency Use Authorization (EUA). This EUA will remain  in effect (meaning this test can be used) for the duration of the COVID-19 declaration under Section 564(b)(1) of the Act, 21 U.S.C.section 360bbb-3(b)(1), unless the authorization is terminated  or revoked sooner.       Influenza A by PCR NEGATIVE NEGATIVE Final   Influenza B by PCR NEGATIVE NEGATIVE Final    Comment: (NOTE) The Xpert Xpress SARS-CoV-2/FLU/RSV plus assay is intended as an aid in the diagnosis of influenza from  Nasopharyngeal swab specimens and should not be used as a sole basis for treatment. Nasal washings and aspirates are unacceptable for Xpert Xpress SARS-CoV-2/FLU/RSV testing.  Fact Sheet for Patients: BloggerCourse.com  Fact Sheet for Healthcare Providers: SeriousBroker.it  This test is not yet approved or cleared by the [place] FDA and has been authorized for detection and/or diagnosis of SARS-CoV-2 by FDA under an Emergency Use Authorization (EUA). This EUA will remain in effect (meaning this test can be used) for the duration of the COVID-19 declaration under Section 564(b)(1) of the Act, 21 U.S.C. section 360bbb-3(b)(1), unless the authorization is terminated or revoked.  Performed at [facility], [address] [place], [place] [address]       Imaging Studies   No results found.   Medications   Scheduled Meds:  atenolol  100 mg Oral Daily   chlorzoxazone  500 mg Oral Daily   feeding supplement  237 mL Oral BID BM   multivitamin with minerals  1 tablet Oral Daily   nortriptyline  50 mg Oral QHS   pantoprazole (PROTONIX) IV  40 mg Intravenous Q12H   polyethylene glycol powder  1 Container Oral Once   rOPINIRole  0.75 mg Oral QHS   Continuous Infusions:  sodium chloride 75 mL/hr at 07/03/20 1309   sodium chloride Stopped (07/03/20 1310)   azithromycin 500 mg (07/03/20 1053)   cefTRIAXone (ROCEPHIN)  IV 2 g (07/03/20 1019)       LOS: 4 days    Time spent: 25 minutes with >50% spent in coordination of care and at bedside.    Pennie Banter, DO [facility]  07/03/2020, 3:42 PM    If 7PM-7AM, please contact night-coverage. How to contact the [facility] Attending or Consulting provider 7A - 7P or covering provider during after hours 7P -7A, for this patient?    Check the care team in [facility] and look for a) attending/consulting [facility] provider listed and b) the [facility] team listed Log into  [email or web address] and use [facility] universal password to access. If you do not have the password, please contact the hospital operator. Locate the [facility] provider you are looking for under [facility] and page to a number that you can be directly reached. If you still have difficulty reaching the provider, please page the [facility] (Director on Call) for the Hospitalists listed on amion for assistance.

## 2020-07-04 ENCOUNTER — Encounter: Payer: Self-pay | Admitting: Family Medicine

## 2020-07-04 ENCOUNTER — Inpatient Hospital Stay: Payer: Medicare Other | Admitting: Anesthesiology

## 2020-07-04 ENCOUNTER — Encounter
Admission: EM | Disposition: A | Discharge status: Home Health | Payer: Self-pay | Source: Home / Self Care | Attending: Internal Medicine

## 2020-07-04 DIAGNOSIS — J189 Pneumonia, unspecified organism: Secondary | ICD-10-CM | POA: Diagnosis not present

## 2020-07-04 DIAGNOSIS — I5032 Chronic diastolic (congestive) heart failure: Secondary | ICD-10-CM | POA: Diagnosis not present

## 2020-07-04 DIAGNOSIS — D500 Iron deficiency anemia secondary to blood loss (chronic): Secondary | ICD-10-CM

## 2020-07-04 DIAGNOSIS — E43 Unspecified severe protein-calorie malnutrition: Secondary | ICD-10-CM | POA: Diagnosis not present

## 2020-07-04 HISTORY — PX: COLONOSCOPY WITH PROPOFOL: SHX5780

## 2020-07-04 HISTORY — PX: ESOPHAGOGASTRODUODENOSCOPY (EGD) WITH PROPOFOL: SHX5813

## 2020-07-04 LAB — HEMOGLOBIN AND HEMATOCRIT, BLOOD
HCT: 24.6 % — ABNORMAL LOW (ref 36.0–46.0)
HCT: 25.5 % — ABNORMAL LOW (ref 36.0–46.0)
Hemoglobin: 8.1 g/dL — ABNORMAL LOW (ref 12.0–15.0)
Hemoglobin: 8.4 g/dL — ABNORMAL LOW (ref 12.0–15.0)

## 2020-07-04 LAB — CULTURE, BLOOD (ROUTINE X 2)
Culture: NO GROWTH
Culture: NO GROWTH
Special Requests: ADEQUATE

## 2020-07-04 LAB — CBC
HCT: 25.4 % — ABNORMAL LOW (ref 36.0–46.0)
Hemoglobin: 8.4 g/dL — ABNORMAL LOW (ref 12.0–15.0)
MCH: 30.4 pg (ref 26.0–34.0)
MCHC: 33.1 g/dL (ref 30.0–36.0)
MCV: 92 fL (ref 80.0–100.0)
Platelets: 166 10*3/uL (ref 150–400)
RBC: 2.76 MIL/uL — ABNORMAL LOW (ref 3.87–5.11)
RDW: 15.5 % (ref 11.5–15.5)
WBC: 14.3 10*3/uL — ABNORMAL HIGH (ref 4.0–10.5)
nRBC: 0 % (ref 0.0–0.2)

## 2020-07-04 LAB — BASIC METABOLIC PANEL
Anion gap: 7 (ref 5–15)
BUN: 8 mg/dL (ref 8–23)
CO2: 18 mmol/L — ABNORMAL LOW (ref 22–32)
Calcium: 7.8 mg/dL — ABNORMAL LOW (ref 8.9–10.3)
Chloride: 102 mmol/L (ref 98–111)
Creatinine, Ser: 0.59 mg/dL (ref 0.44–1.00)
GFR, Estimated: >60 mL/min (ref >60)
Glucose, Bld: 124 mg/dL — ABNORMAL HIGH (ref 70–99)
Potassium: 4.1 mmol/L (ref 3.5–5.1)
Sodium: 127 mmol/L — ABNORMAL LOW (ref 135–145)

## 2020-07-04 LAB — PHOSPHORUS: Phosphorus: 1.3 mg/dL — ABNORMAL LOW (ref 2.5–4.6)

## 2020-07-04 LAB — PROTIME-INR
INR: 1.7 — ABNORMAL HIGH (ref 0.8–1.2)
Prothrombin Time: 19.4 seconds — ABNORMAL HIGH (ref 11.4–15.2)

## 2020-07-04 LAB — MAGNESIUM: Magnesium: 2.4 mg/dL (ref 1.7–2.4)

## 2020-07-04 SURGERY — ESOPHAGOGASTRODUODENOSCOPY (EGD) WITH PROPOFOL
Anesthesia: General

## 2020-07-04 MED ORDER — SODIUM CHLORIDE 0.9 % IV SOLN: INTRAVENOUS | Status: DC

## 2020-07-04 MED ORDER — WARFARIN SODIUM 7.5 MG PO TABS
7.5000 mg | ORAL_TABLET | Freq: Once | ORAL | Status: AC
  Administered 2020-07-04: 17:00:00 7.5 mg via ORAL
  Filled 2020-07-04: qty 1

## 2020-07-04 MED ORDER — PROPOFOL 500 MG/50ML IV EMUL
INTRAVENOUS | Status: AC
  Filled 2020-07-04: qty 50

## 2020-07-04 MED ORDER — PHENYLEPHRINE HCL (PRESSORS) 10 MG/ML IV SOLN
INTRAVENOUS | Status: DC | PRN
  Administered 2020-07-04 (×5): 100 ug via INTRAVENOUS

## 2020-07-04 MED ORDER — LIDOCAINE HCL (CARDIAC) PF 100 MG/5ML IV SOSY
PREFILLED_SYRINGE | INTRAVENOUS | Status: DC | PRN
  Administered 2020-07-04: 40 mg via INTRAVENOUS

## 2020-07-04 MED ORDER — SODIUM CHLORIDE 0.9 % IV SOLN: INTRAVENOUS | Status: DC | PRN

## 2020-07-04 MED ORDER — SODIUM PHOSPHATES 45 MMOLE/15ML IV SOLN
20.0000 mmol | Freq: Once | INTRAVENOUS | Status: AC
  Administered 2020-07-04: 20:00:00 20 mmol via INTRAVENOUS
  Filled 2020-07-04: qty 6.67

## 2020-07-04 MED ORDER — WARFARIN - PHARMACIST DOSING INPATIENT: Freq: Every day | Status: DC

## 2020-07-04 MED ORDER — PANTOPRAZOLE SODIUM 40 MG PO TBEC
40.0000 mg | DELAYED_RELEASE_TABLET | Freq: Two times a day (BID) | ORAL | Status: DC
  Administered 2020-07-04 – 2020-07-08 (×8): 40 mg via ORAL
  Filled 2020-07-04 (×8): qty 1

## 2020-07-04 MED ORDER — PROPOFOL 500 MG/50ML IV EMUL
INTRAVENOUS | Status: DC | PRN
  Administered 2020-07-04: 150 ug/kg/min via INTRAVENOUS

## 2020-07-04 MED ORDER — SODIUM CHLORIDE 0.9 % IV SOLN
1.0000 g | INTRAVENOUS | Status: DC
  Administered 2020-07-04: 15:00:00 1 g via INTRAVENOUS
  Filled 2020-07-04: qty 10
  Filled 2020-07-04: qty 1

## 2020-07-04 MED ORDER — PROPOFOL 10 MG/ML IV BOLUS
INTRAVENOUS | Status: DC | PRN
  Administered 2020-07-04: 10 mg via INTRAVENOUS
  Administered 2020-07-04: 70 mg via INTRAVENOUS
  Administered 2020-07-04: 10 mg via INTRAVENOUS

## 2020-07-04 MED ORDER — POTASSIUM PHOSPHATES 15 MMOLE/5ML IV SOLN: 20.0000 mmol | Freq: Once | INTRAVENOUS | Status: DC

## 2020-07-04 NOTE — Op Note (Signed)
[facility] Gastroenterology Patient Name: Shelby Werner Procedure Date: 07/04/2020 8:49 AM MRN: [id number] Account #: [id number] Date of Birth: Jun 16, 1951 Admit Type: Inpatient Age: 69 Room: [facility] ENDO ROOM 4 Gender: Female Note Status: Finalized Procedure:             Colonoscopy Indications:           Anemia Providers:             Toney Reil MD, MD Medicines:             General Anesthesia Complications:         No immediate complications. Estimated blood loss: None. Procedure:             Pre-Anesthesia Assessment:                        - Prior to the procedure, a History and Physical was                         performed, and patient medications and allergies were                         reviewed. The patient is unable to give consent                         secondary to the patient being legally incompetent to                         consent. The risks and benefits of the procedure and                         the sedation options and risks were discussed with the                         patient's spouse. All questions were answered and                         informed consent was obtained. Patient identification                         and proposed procedure were verified by the physician,                         the nurse, the anesthesiologist, the anesthetist and                         the technician in the pre-procedure area in the                         procedure room in the endoscopy suite. Mental Status                         Examination: alert and oriented. Airway Examination:                         normal oropharyngeal airway and neck mobility.                         Respiratory Examination: clear to auscultation. CV  Examination: normal. Prophylactic Antibiotics: The                         patient does not require prophylactic antibiotics.                         Prior Anticoagulants: The patient has taken  Coumadin                         (warfarin), last dose was 5 days prior to procedure.                         ASA Grade Assessment: III - A patient with severe                         systemic disease. After reviewing the risks and                         benefits, the patient was deemed in satisfactory                         condition to undergo the procedure. The anesthesia                         plan was to use general anesthesia. Immediately prior                         to administration of medications, the patient was                         re-assessed for adequacy to receive sedatives. The                         heart rate, respiratory rate, oxygen saturations,                         blood pressure, adequacy of pulmonary ventilation, and                         response to care were monitored throughout the                         procedure. The physical status of the patient was                         re-assessed after the procedure.                        After obtaining informed consent, the colonoscope was                         passed under direct vision. Throughout the procedure,                         the patient's blood pressure, pulse, and oxygen                         saturations were monitored continuously. The  Colonoscope was introduced through the anus and                         advanced to the the cecum, identified by appendiceal                         orifice and ileocecal valve. The colonoscopy was                         performed with moderate difficulty due to significant                         looping. Successful completion of the procedure was                         aided by applying abdominal pressure. The patient                         tolerated the procedure well. The quality of the bowel                         preparation was evaluated using the BBPS [facility] Bowel                         Preparation Scale) with scores  of: Right Colon = 3,                         Transverse Colon = 3 and Left Colon = 3 (entire mucosa                         seen well with no residual staining, small fragments                         of stool or opaque liquid). The total BBPS score                         equals 9. Findings:      The perianal and digital rectal examinations were normal. Pertinent       negatives include normal sphincter tone and no palpable rectal lesions.      The entire examined colon appeared normal. Impression:            - The entire examined colon is normal.                        - No specimens collected. Recommendation:        - Return patient to hospital ward for ongoing care.                        - Cardiac diet today.                        - Continue present medications.                        - Post-Procedure Resumption of Anticoagulants: Restart  warfarin today, adjustment per managing physician to                         maintain INR per protocol. Procedure Code(s):     --- Professional ---                        [phone], Colonoscopy, flexible; diagnostic, including                         collection of specimen(s) by brushing or washing, when                         performed (separate procedure) Diagnosis Code(s):     --- Professional ---                        D64.9, Anemia, unspecified CPT copyright 2019 American Medical Association. All rights reserved. The codes documented in this report are preliminary and upon coder review may  be revised to meet current compliance requirements. Dr. Libby Mawonini Wahneta Derocher Toney Reilohini Reddy Shonica Weier MD, MD 07/04/2020 9:37:24 AM This report has been signed electronically. Number of Addenda: 0 Note Initiated On: 07/04/2020 8:49 AM Scope Withdrawal Time: 0 hours 5 minutes 28 seconds  Total Procedure Duration: 0 hours 14 minutes 10 seconds  Estimated Blood Loss:  Estimated blood loss: none.      [facility]

## 2020-07-04 NOTE — Progress Notes (Addendum)
PROGRESS NOTE    Shelby Werner   EHM:[id number]  DOB: 09/30/1950  PCP: Gracelyn Nurse, MD    DOA: 06/29/2020 LOS: 5   Brief Narrative    Shelby Werner is a 69 y.o. 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  Patient found to have RUL opacity on chest xray and right hilar/mediastinal adenopathy versus mass.    Patient admitted for further management of severe sepsis due to pneumonia.    Discussed aortic dissection finding with [facility] cardiothoracic surgeon Dr. Randel Books who felt this to be an incidental finding and likely chronic.  Given patient asymptomatic, hemodynamically stable and currently with pneumonia, transfer to [facility] for surgical evaluation was deferred.  She was provided pt's husband contact information and her clinic is to contact him to set up outpatient appointment for after patient recovered from pneumonia.       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  Na trend: 134>>133>>131>>130>>127 Fluids are now stopped and pt eating again. BMP in AM.     Hypophosphatemia - likely from malnutrition.  Pharmacy consulted for electrolyte replacement.  Hypokalemia - K 2.9 on adm, replaced.  Monitor BMP    Acute blood loss anemia due to GI bleeding -patient  having melanotic liquidy stools.   Hemoglobin has been trending down from 10.3 on admission, as low as 7.9. 12/26 Hbg relatively stable, no further report of melena. EGD showed large hiatal hernia and esophagitis but no active bleeding.   Suspect slow intermittent blood loss from ulcerations of hiatal hernia vs AVM's more distally. --Consult GI --Resume warfarin --oral PPI twice daily, indefinitely --Diet resumed --Anemia panel shows iron deficiency, normal B12 and folate   Severe Sepsis 2/2 community-acquired PNA - sepsis POA with tachycardia, tachypnea, leukocytosis, in setting of PNA. Elevated lactate and acute encephalopathy reflects organ dysfunction.   Lactic acidosis has resolved and sepsis physiology improved. --Continue Rocephin and Zithromax  --Follow cultures -negative to date   Urinary Tract Infection - pt poor historian so unclear if symptomatic.  Urine cx growing pansensitive E. coli.   --On abx for PNA as above   Subtherapeutic INR -  Hx of aortic valve replacement  Was started on heparin gtt on admission after finding of aortic dissection in case of surgical intervention which is deferred until patient recovered from infections and will be evaluated as outpatient. All anticoagulation was held due to acute anemia and concern for bleeding. Resume on warfarin tonight.  Per cardiology, no need to bridge.    Type A Aortic Dissection - seen on CT chest after xray showed right hilar and mediastinal adenopathy or mass. Transfer to [facility] was pursued, but deferred due to infection and CT surgery felt was incidental finding without urgent need for intervention.  Pt may not  be great candidate. [facility] CT surgeon's office to contact husband to arrange outpatient follow up.  --on heparin which was recommended by [facility] CTS last night.  Warfarin held. --target SBP <130 and HR in 60's if possible    Acute metabolic encephalopathy due to infection - 2 day hx confusion over baseline per  husband.  At baseline 12/22 on AM rounds per husband.  CT head was negative for acute findings.  Monitor.  Delirium precautions. --resume home nortriptyline, Parafon, and Requip which were initially held    Chronic diastolic CHF - hypovolemic on admission.   On IV fluids.  Monitor volume status closely.   --continue beta-blocker and ARB     Normocytic anemia - no evidence of bleeding.  Monitor CBC.  Anemia panel with AM labs.    Dementia - no behavioral issues.  Appears mild.   12/22 - at baseline mental status per husband. --Delirium precautions    Thrombocytopenia - Plt 128k on admission with intermittent lows in past.  Suspect due to sepsis.  Follow CBC.   DVT prophylaxis: scd's for now, to resume warfarin this evening   Diet:  Diet Orders (From admission, onward)    Start     Ordered   07/04/20 0943  Diet Heart Room service appropriate? Yes; Fluid consistency: Thin  Diet effective now       Question Answer Comment  Room service appropriate? Yes   Fluid consistency: Thin      07/04/20 0942            Code Status: Full Code    Subjective 07/04/20    Pt seen after endoscopy today.  Reports feeling okay.  Ate a lot per husband.  No abdominal pain or other complaints.  Wants to get home soon.   Disposition Plan & Communication   Status is: Inpatient  Inpatient status remains appropriate due to severity of illness.  Patient has acute blood loss anemia with GI bleeding.  Requires further monitoring of hemoglobin for stability and resumption of anticoagulation.  Dispo: The patient is from: home              Anticipated d/c is to: home              Anticipated d/c date is: 1-2 days              Patient currently is not medically stable for d/c.  Family Communication: husband at bedside on rounds.    Consults, Procedures, Significant Events   Consultants: None  Procedures:  None  Antimicrobials:  Anti-infectives (From admission, onward)   Start     Dose/Rate  Route Frequency Ordered Stop   07/04/20 1400  cefTRIAXone (ROCEPHIN) 1 g in sodium chloride 0.9 % 100 mL IVPB        1 g 200 mL/hr over 30 Minutes Intravenous Every 24 hours 07/04/20 1328     06/30/20 0900  azithromycin (ZITHROMAX) 500 mg in sodium chloride 0.9 % 250 mL IVPB        500 mg 250 mL/hr over 60 Minutes Intravenous Every 24 hours 06/29/20 2227 07/04/20 0930   06/30/20 0800  cefTRIAXone (ROCEPHIN) 2 g in sodium chloride 0.9 % 100 mL IVPB  Status:  Discontinued        2 g 200 mL/hr over 30 Minutes Intravenous Every 24 hours 06/29/20 2227 07/04/20 1329   06/29/20 2130  vancomycin (VANCOCIN) IVPB 1000 mg/200 mL premix        1,000 mg 200 mL/hr  over 60 Minutes Intravenous  Once 06/29/20 2116 06/29/20 2228   06/29/20 2100  piperacillin-tazobactam (ZOSYN) IVPB 3.375 g        3.375 g 100 mL/hr over 30 Minutes Intravenous  Once 06/29/20 2054 06/29/20 2225         Objective   Vitals:   07/04/20 0955 07/04/20 1003 07/04/20 1021 07/04/20 1150  BP: 105/68 106/74 97/64 109/72  Pulse:    71  Resp:    16  Temp:    98.4 F (36.9 C)  TempSrc:      SpO2: 100%   100%  Weight:      Height:        Intake/Output Summary (Last 24 hours) at 07/04/2020 1556 Last data filed at 07/04/2020 0933 Gross per 24 hour  Intake 1300.14 ml  Output --  Net 1300.14 ml   Filed Weights   07/01/20 1039 07/02/20 0343 07/03/20 0339  Weight: 53.1 kg 58.2 kg 57 kg    Physical Exam:  General exam: awake, up in chair, no acute distress Respiratory system: CTAB, normal respiratory effort. Cardiovascular system: normal S1/S2, RRR, systolic murmur, no pedal edema.   Neurologic: no gross focal motor deficits, A&Ox3, normal speech GI: soft and non-tender   Labs   Data Reviewed: I have personally reviewed following labs and imaging studies  CBC: Recent Labs  Lab 06/29/20 1725 06/30/20 0626 07/01/20 0451 07/02/20 0804 07/02/20 1627 07/03/20 0547 07/03/20 0958 07/03/20 1528 07/03/20 2146  07/04/20 0448 07/04/20 1524  WBC 21.8* 17.7* 16.1* 15.1*  --  11.9*  --   --   --  14.3*  --   NEUTROABS 20.3*  --   --   --   --   --   --   --   --   --   --   HGB 10.3* 9.7* 9.4* 8.9*   < > 8.1* 7.9* 8.9* 9.1* 8.4* 8.1*  HCT 31.0* 29.1* 27.7* 27.2*   < > 23.4* 24.0* 27.4* 29.1* 25.4* 24.6*  MCV 92.5 92.7 91.4 93.2  --  90.3  --   --   --  92.0  --   PLT 128* 124* 126* 130*  --  136*  --   --   --  166  --    < > = values in this interval not displayed.   Basic Metabolic Panel: Recent Labs  Lab 06/30/20 0626 07/01/20 0451 07/02/20 0804 07/03/20 0547 07/04/20 0448  NA 134* 133* 131* 130* 127*  K 3.4* 2.9* 3.4* 3.4* 4.1  CL 98 102 103 105 102  CO2 27 23 20* 18* 18*  GLUCOSE 100* 109* 111* 119* 124*  BUN CREATININE 0.76 0.65 0.56 0.61 0.59  CALCIUM 8.2* 7.8* 7.8* 7.7* 7.8*  MG 2.1 2.0  --   --  2.4  PHOS  --   --   --   --  1.3*   GFR: Estimated Creatinine Clearance: 51.8 mL/min (by C-G formula based on SCr of 0.59 mg/dL). Liver Function Tests: Recent Labs  Lab 06/29/20 1725 07/03/20 0547  AST 23 19  ALT 13 14  ALKPHOS 80 53  BILITOT 1.2 0.5  PROT 6.7 5.4*  ALBUMIN 3.5 2.5*   No results for input(s): LIPASE, AMYLASE in the last 168 hours. No results for input(s): AMMONIA in the last 168 hours. Coagulation Profile: Recent Labs  Lab 06/29/20 1725 06/30/20 0626 07/01/20 0451 07/02/20 0804 07/04/20 0448  INR 2.0* 2.1* 1.5* 1.5*  1.7*   Cardiac Enzymes: No results for input(s): CKTOTAL, CKMB, CKMBINDEX, TROPONINI in the last 168 hours. BNP (last 3 results) No results for input(s): PROBNP in the last 8760 hours. HbA1C: No results for input(s): HGBA1C in the last 72 hours. CBG: Recent Labs  Lab 06/29/20 1724  GLUCAP 107*   Lipid Profile: No results for input(s): CHOL, HDL, LDLCALC, TRIG, CHOLHDL, LDLDIRECT in the last 72 hours. Thyroid Function Tests: No results for input(s): TSH, T4TOTAL, FREET4, T3FREE, THYROIDAB in the last 72  hours. Anemia Panel: Recent Labs    07/02/20 0804  VITAMINB12 606  FOLATE 7.0  FERRITIN 278  TIBC 203*  IRON 13*  RETICCTPCT 1.7   Sepsis Labs: Recent Labs  Lab 06/29/20 1740 06/29/20 1945 06/29/20 2310 06/30/20 0256 06/30/20 0626 07/01/20 0451  PROCALCITON  --   --   --  11.82 12.22 5.96  LATICACIDVEN 2.3* 2.3* 2.6*  --   --  1.4    Recent Results (from the past 240 hour(s))  Culture, blood (Routine X 2) w Reflex to ID Panel     Status: None   Collection Time: 06/29/20  7:45 PM   Specimen: BLOOD  Result Value Ref Range Status   Specimen Description BLOOD RIGHT ANTECUBITAL  Final   Special Requests   Final    BOTTLES DRAWN AEROBIC AND ANAEROBIC Blood Culture adequate volume   Culture   Final    NO GROWTH 5 DAYS Performed at [facility], [address] [place], [place] [address]    Report Status 07/04/2020 FINAL  Final  Urine Culture     Status: Abnormal   Collection Time: 06/29/20  8:33 PM   Specimen: Urine, Random  Result Value Ref Range Status   Specimen Description   Final    URINE, RANDOM Performed at [facility], [address], [place], [place] [address]    Special Requests   Final    NONE Performed at [facility], [address] [place], [place] [address]    Culture 20,000 COLONIES/mL ESCHERICHIA COLI (A)  Final   Report Status 07/01/2020 FINAL  Final   Organism ID, Bacteria ESCHERICHIA COLI (A)  Final      Susceptibility   Escherichia coli - MIC*    AMPICILLIN 4 SENSITIVE Sensitive     CEFAZOLIN <=4 SENSITIVE Sensitive     CEFEPIME <=0.12 SENSITIVE Sensitive     CEFTRIAXONE <=0.25 SENSITIVE Sensitive     CIPROFLOXACIN <=0.25 SENSITIVE Sensitive     GENTAMICIN <=1 SENSITIVE Sensitive     IMIPENEM <=0.25 SENSITIVE Sensitive     NITROFURANTOIN <=16 SENSITIVE Sensitive     TRIMETH/SULFA <=20 SENSITIVE Sensitive     AMPICILLIN/SULBACTAM <=2 SENSITIVE Sensitive     PIP/TAZO <=4 SENSITIVE Sensitive     * 20,000  COLONIES/mL ESCHERICHIA COLI  Culture, blood (Routine X 2) w Reflex to ID Panel     Status: None   Collection Time: 06/29/20  8:35 PM   Specimen: BLOOD  Result Value Ref Range Status   Specimen Description BLOOD LEFT ANTECUBITAL  Final   Special Requests   Final    BOTTLES DRAWN AEROBIC AND ANAEROBIC Blood Culture results may not be optimal due to an excessive volume of blood received in culture bottles   Culture   Final    NO GROWTH 5 DAYS Performed at [facility], [address], [place], [place] [address]    Report Status 07/04/2020 FINAL  Final  Resp Panel by RT-PCR (  Flu A&B, Covid) Nasopharyngeal Swab     Status: None   Collection Time: 06/29/20 11:13 PM   Specimen: Nasopharyngeal Swab; Nasopharyngeal(NP) swabs in vial transport medium  Result Value Ref Range Status   SARS Coronavirus 2 by RT PCR NEGATIVE NEGATIVE Final    Comment: (NOTE) SARS-CoV-2 target nucleic acids are NOT DETECTED.  The SARS-CoV-2 RNA is generally detectable in upper respiratory specimens during the acute phase of infection. The lowest concentration of SARS-CoV-2 viral copies this assay can detect is 138 copies/mL. A negative result does not preclude SARS-Cov-2 infection and should not be used as the sole basis for treatment or other patient management decisions. A negative result may occur with  improper specimen collection/handling, submission of specimen other than nasopharyngeal swab, presence of viral mutation(s) within the areas targeted by this assay, and inadequate number of viral copies(<138 copies/mL). A negative result must be combined with clinical observations, patient history, and epidemiological information. The expected result is Negative.  Fact Sheet for Patients:  BloggerCourse.com  Fact Sheet for Healthcare Providers:  SeriousBroker.it  This test is no t yet approved or cleared by the [place] FDA and  has been  authorized for detection and/or diagnosis of SARS-CoV-2 by FDA under an Emergency Use Authorization (EUA). This EUA will remain  in effect (meaning this test can be used) for the duration of the COVID-19 declaration under Section 564(b)(1) of the Act, 21 U.S.C.section 360bbb-3(b)(1), unless the authorization is terminated  or revoked sooner.       Influenza A by PCR NEGATIVE NEGATIVE Final   Influenza B by PCR NEGATIVE NEGATIVE Final    Comment: (NOTE) The Xpert Xpress SARS-CoV-2/FLU/RSV plus assay is intended as an aid in the diagnosis of influenza from Nasopharyngeal swab specimens and should not be used as a sole basis for treatment. Nasal washings and aspirates are unacceptable for Xpert Xpress SARS-CoV-2/FLU/RSV testing.  Fact Sheet for Patients: BloggerCourse.com  Fact Sheet for Healthcare Providers: SeriousBroker.it  This test is not yet approved or cleared by the [place] FDA and has been authorized for detection and/or diagnosis of SARS-CoV-2 by FDA under an Emergency Use Authorization (EUA). This EUA will remain in effect (meaning this test can be used) for the duration of the COVID-19 declaration under Section 564(b)(1) of the Act, 21 U.S.C. section 360bbb-3(b)(1), unless the authorization is terminated or revoked.  Performed at [facility], [address] [place], [place] [address]       Imaging Studies   No results found.   Medications   Scheduled Meds: . atenolol  100 mg Oral Daily  . chlorzoxazone  500 mg Oral Daily  . feeding supplement  237 mL Oral BID BM  . multivitamin with minerals  1 tablet Oral Daily  . nortriptyline  50 mg Oral QHS  . pantoprazole  40 mg Oral BID AC  . rOPINIRole  0.75 mg Oral QHS  . warfarin  7.5 mg Oral ONCE-1600  . Warfarin - Pharmacist Dosing Inpatient   Does not apply q1600   Continuous Infusions: . sodium chloride Stopped (07/03/20 1310)  .  cefTRIAXone (ROCEPHIN)  IV 1 g (07/04/20 1454)  . sodium phosphate  Dextrose 5% IVPB         LOS: 5 days    Time spent: 25 minutes with >50% spent in coordination of care and at bedside.    Pennie Banter, DO [facility]  07/04/2020, 3:56 PM    If 7PM-7AM, please contact night-coverage. How to contact  the [facility] Attending or Consulting provider 7A - 7P or covering provider during after hours 7P -7A, for this patient?    1. Check the care team in [facility] and look for a) attending/consulting [facility] provider listed and b) the [facility] team listed 2. Log into [email or web address] and use [facility]'s universal password to access. If you do not have the password, please contact the hospital operator. 3. Locate the [facility] provider you are looking for under [facility] and page to a number that you can be directly reached. 4. If you still have difficulty reaching the provider, please page the [facility] (Director on Call) for the Hospitalists listed on amion for assistance.

## 2020-07-04 NOTE — Consult Note (Addendum)
PHARMACY CONSULT NOTE - FOLLOW UP  Pharmacy Consult for Electrolyte Monitoring and Replacement   Recent Labs: Potassium (mmol/L)  Date Value  07/04/2020 4.1   Magnesium (mg/dL)  Date Value  19/59/7471 2.0   Calcium (mg/dL)  Date Value  85/50/1586 7.8 (L)   Albumin (g/dL)  Date Value  82/57/4935 2.5 (L)   Phosphorus (mg/dL)  Date Value  52/17/4715 1.3 (L)   Sodium (mmol/L)  Date Value  07/04/2020 127 (L)     Assessment: Pharmacy has been consulted for electrolyte management. Patient has hypophosphatemia (phosphorus 1.3), hyponatremia, and no magnesium level checked for today.  Corrected calcium ~9 mg/dL. Patient is currently on NS @ 100 cc/hr. Patient is currently off the floor for a procedure.   Goal of Therapy:  Electrolytes WNL.   Plan:   Will order Sodium Phosphate IV 20 mmol x1. Will recheck phosphorus level at 2000 since patient is off the unit for a procedure.   Will add-on magnesium level for today. Will follow-up and replace as needed.   Will check electrolytes tomorrow with AM labs.   Addendum @ 1112: Mg 2.4. No replacement needed.  Katha Cabal ,PharmD Clinical Pharmacist 07/04/2020 8:27 AM

## 2020-07-04 NOTE — Transfer of Care (Signed)
Immediate Anesthesia Transfer of Care Note  Patient: Shelby Werner  Procedure(s) Performed: Procedure(s): ESOPHAGOGASTRODUODENOSCOPY (EGD) WITH PROPOFOL (N/A) COLONOSCOPY WITH PROPOFOL (N/A)  Patient Location: PACU and Endoscopy Unit  Anesthesia Type:General  Level of Consciousness: sedated  Airway & Oxygen Therapy: Patient Spontanous Breathing and Patient connected to nasal cannula oxygen  Post-op Assessment: Report given to RN and Post -op Vital signs reviewed and stable  Post vital signs: Reviewed and stable  Last Vitals:  Vitals:   07/04/20 0837 07/04/20 0935  BP: 125/71 (!) 82/59  Pulse: 77   Resp: 17   Temp: 36.5 C   SpO2: 91%     Complications: No apparent anesthesia complications

## 2020-07-04 NOTE — Consult Note (Signed)
[facility] Cardiology Consultation Note  Patient ID: Shelby Werner, MRN: [id number], DOB/AGE: [id number] 70 y.o. Admit date: 06/29/2020   Date of Consult: 07/04/2020 Primary Physician: Shelby Nurse, MD Primary Cardiologist: Shelby Werner  Chief Complaint:  Chief Complaint  Patient presents with  . Altered Mental Status   Reason for Consult: Aortic valve replacement  HPI: 69 y.o. 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  The patient has had previous stroke issue but has done fairly well in the last many years.  Patient has been on atenolol for heart rate control and blood pressure control as well as warfarin for further risk reduction in thrombosis of the valve.  Over the years the patient has done fairly well and has had some chronic anemia for which she has received iron infusions in the past.  In October the patient continue to have some mild anemia but overall was doing well with iron supplementation.  Recently the patient has had significant worsening symptoms and delirium for which she was seen in the emergency room.  At that time it does appear that the patient had a right upper lobe pneumonia with some sepsis.  She was placed on appropriate antibiotics and has had improvements of oxygenation and currently stabilized at this time.  The last time she had an echocardiogram was in October showing normal LV systolic function with ejection fraction of 55% with left ventricular hypertrophy moderate mitral regurgitation and normally seated aortic valve.  Velocities of aortic valve more reasonable.  The patient also has had a CT scan during her hospital admission showing an aortic aneurysm of 7.6 cm with dissection which has been analyzed by CT surgery and appears to be relatively potentially chronic and stable and will not pursue  further intervention at this time but will need outpatient surveillance and further treatment.  Currently it does not appear to be used effecting her current condition.  The patient did have blood cultures which were negative and there is no current evidence of endocarditis.  She does have a E. coli in the urine for which the patient has had appropriate antibiotic treatment.  EKG shows normal sinus rhythm with ventricular pacing and has been unchanged throughout her hospital course.  Overall the patient has been stable from the cardiovascular standpoint but did have some decrease in hemoglobin.  There is no apparent source of bleeding and her iron evaluation suggested that this was chronic anemia.  She could have AVMs or other associated problems from the gastric and colonic issue.  Therefore the patient will need further evaluation and treatment options of potential source of bleeding.  Currently she has been stable throughout this hospitalization with no evidence of congestive heart failure or anginal symptoms or progression of cardiovascular issues as noted above.  Therefore she will be at low risk for cardiovascular complication with endoscopy colonoscopy to assess for these issues  Past Medical History:  Diagnosis Date  . Hypertension       Surgical History:  Past Surgical History:  Procedure Laterality Date  . ABDOMINAL HYSTERECTOMY    . AUGMENTATION MAMMAPLASTY Bilateral    breast implants     Home Meds: Prior to Admission medications   Medication Sig Start Date End Date Taking? Authorizing Provider  atenolol (TENORMIN) 100 MG tablet Take 100 mg by mouth daily. 05/31/20  Yes [provider]  butalbital-acetaminophen-caffeine (FIORICET  WITH CODEINE) 50-325-40-30 MG capsule Take 1 capsule by mouth 4 (four) times daily as needed. 06/22/20  Yes [provider]  Calcium Carbonate-Vitamin D 600-400 MG-UNIT tablet Take 1 tablet by mouth in the morning and at bedtime.   Yes  [provider]  celecoxib (CELEBREX) 100 MG capsule Take 100 mg by mouth 2 (two) times daily. 06/25/20  Yes [provider]  chlorzoxazone (PARAFON) 500 MG tablet Take 500 mg by mouth daily. 06/28/20  Yes [provider]  furosemide (LASIX) 20 MG tablet Take 20 mg by mouth daily. 05/31/20  Yes [provider]  losartan (COZAAR) 100 MG tablet Take 100 mg by mouth daily. 05/31/20  Yes [provider]  nortriptyline (PAMELOR) 50 MG capsule Take 50 mg by mouth at bedtime. 05/31/20  Yes [provider]  rOPINIRole (REQUIP) 0.25 MG tablet Take 0.75 mg by mouth at bedtime. 06/25/20  Yes [provider]  traMADol (ULTRAM) 50 MG tablet Take 50 mg by mouth every 8 (eight) hours as needed. 05/24/20  Yes [provider]  warfarin (COUMADIN) 5 MG tablet Take 5 mg by mouth daily. 06/25/20  Yes [provider]    Inpatient Medications:  . atenolol  100 mg Oral Daily  . chlorzoxazone  500 mg Oral Daily  . feeding supplement  237 mL Oral BID BM  . multivitamin with minerals  1 tablet Oral Daily  . nortriptyline  50 mg Oral QHS  . pantoprazole (PROTONIX) IV  40 mg Intravenous Q12H  . rOPINIRole  0.75 mg Oral QHS   . sodium chloride 75 mL/hr at 07/04/20 0211  . sodium chloride Stopped (07/03/20 1310)  . azithromycin 500 mg (07/03/20 1053)  . cefTRIAXone (ROCEPHIN)  IV 2 g (07/03/20 1019)    Allergies: No Known Allergies  Social History   Socioeconomic History  . Marital status: Married    Spouse name: Not on file  . Number of children: Not on file  . Years of education: Not on file  . Highest education level: Not on file  Occupational History  . Not on file  Tobacco Use  . Smoking status: Former Games developermoker  . Smokeless tobacco: Never Used  Substance and Sexual Activity  . Alcohol use: Not Currently  . Drug use: Never  . Sexual activity: Not on file  Other Topics Concern  . Not on file  Social History Narrative   . Not on file   Social Determinants of Health   Financial Resource Strain: Not on file  Food Insecurity: Not on file  Transportation Needs: Not on file  Physical Activity: Not on file  Stress: Not on file  Social Connections: Not on file  Intimate Partner Violence: Not on file     Family History  Problem Relation Age of Onset  . Breast cancer Paternal Aunt   . Breast cancer Paternal Aunt      Review of Systems Positive for weakness fatigue shortness of breath Negative for: General:  chills, fever, night sweats or weight changes.  Cardiovascular: PND orthopnea syncope dizziness  Dermatological skin lesions rashes Respiratory: Cough congestion Urologic: Frequent urination urination at night and hematuria Abdominal: negative for nausea, vomiting, diarrhea, bright red blood per rectum, melena, or hematemesis Neurologic: negative for visual changes, and/or hearing changes  All other systems reviewed and are otherwise negative except as noted above.  Labs: No results for input(s): CKTOTAL, CKMB, TROPONINI in the last 72 hours. Lab Results  Component Value Date   WBC 14.3 (  H) 07/04/2020   HGB 8.4 (L) 07/04/2020   HCT 25.4 (L) 07/04/2020   MCV 92.0 07/04/2020   PLT 166 07/04/2020    Recent Labs  Lab 07/03/20 0547 07/04/20 0448  NA 130* 127*  K 3.4* 4.1  CL 105 102  CO2 18* 18*  BUN 9 8  CREATININE 0.61 0.59  CALCIUM 7.7* 7.8*  PROT 5.4*  --   BILITOT 0.5  --   ALKPHOS 53  --   ALT 14  --   AST 19  --   GLUCOSE 119* 124*   No results found for: CHOL, HDL, LDLCALC, TRIG No results found for: DDIMER  Radiology/Studies:  CT HEAD WO CONTRAST  Result Date: 06/29/2020 CLINICAL DATA:  Neuro deficit, acute stroke suspected. Patient has history of dementia. EXAM: CT HEAD WITHOUT CONTRAST TECHNIQUE: Contiguous axial images were obtained from the base of the skull through the vertex without intravenous contrast. COMPARISON:  CT head 11/29/2017 FINDINGS: Brain: Patchy  and confluent areas of decreased attenuation are noted throughout the deep and periventricular white matter of the cerebral hemispheres bilaterally, compatible with chronic microvascular ischemic disease. Encephalomalacia of the right temporal and occipital lobes again noted. No evidence of large-territorial acute infarction. No parenchymal hemorrhage. No mass lesion. No extra-axial collection. No mass effect or midline shift. No hydrocephalus. Basilar cisterns are patent. Vascular: No hyperdense vessel. Skull: No acute fracture or focal lesion. Sinuses/Orbits: Paranasal sinuses and mastoid air cells are clear. The orbits are unremarkable. Other: None. IMPRESSION: No acute intracranial abnormality. Electronically Signed   By: Tish Frederickson M.D.   On: 06/29/2020 18:05   CT CHEST W CONTRAST  Result Date: 06/29/2020 CLINICAL DATA:  69 year old female with abnormal chest radiograph. EXAM: CT CHEST WITH CONTRAST TECHNIQUE: Multidetector CT imaging of the chest was performed during intravenous contrast administration. CONTRAST:  47mL OMNIPAQUE IOHEXOL 300 MG/ML  SOLN COMPARISON:  Earlier radiograph dated 06/29/2020. FINDINGS: Evaluation of this exam is limited due to respiratory motion artifact. Cardiovascular: There is moderate cardiomegaly. No pericardial effusion. There is focal area of calcification adjacent to the mechanical aortic valve. There is aneurysmal dilatation of the aortic root and ascending aorta measuring up to 7.6 cm in AP diameter. There is a dissection flap origin ating from the root of the aorta and extending superiorly in the ascending aorta terminating in the proximal arch just proximal to the takeoff of the right brachiocephalic trunk. No periaortic fluid collection. No extraluminal contrast. There is a 1.8 x 2.4 cm focal blooming of the anterior wall of the aortic root (75/2). The origins of the great vessels of the aortic arch appear patent as visualized. The descending aorta is tortuous  otherwise unremarkable. Evaluation of the pulmonary arteries is limited due to suboptimal opacification and timing of the contrast. A left pectoral pacemaker device noted. Mediastinum/Nodes: Mildly enlarged right hilar lymph nodes, likely reactive. Subcarinal lymph node measures 16 mm in short axis. The esophagus is grossly unremarkable. No mediastinal fluid collection. Lungs/Pleura: There is a large area of opacity in the right upper lobe most consistent with pneumonia. Clinical correlation and follow-up to resolution recommended. Additional scattered clusters of ground-glass opacity involving the right middle lobe and right lower lobe likely pneumonia or aspiration. The left lung remains clear. There are trace bilateral pleural effusions. No pneumothorax. The central airways are patent. Upper Abdomen: A 6.5 cm cyst in the dome of the liver. Additional smaller hypodense lesions as well as a 12 mm enhancing nodule in the right lobe of  the liver and a faint 2.2 x 2.1 cm enhancing lesion in the right lobe of the liver (145/2) are not characterized. Musculoskeletal: Bilateral breast implants with calcified capsule. There is osteopenia with degenerative changes of the spine. Age indeterminate T11 compression fracture with greater than 50% loss of vertebral body height and anterior wedging. Correlation with clinical exam and point tenderness recommended. Median sternotomy wires. IMPRESSION: 1. 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 There is a 1.8 x 2.4 cm focal blooming of the anterior wall of the aortic root. Vascular surgery consult is advised. No periaortic fluid collection or evidence of rupture. 2. Multifocal pneumonia predominantly involving the right upper lobe. 3. Trace bilateral pleural effusions. 4. Moderate cardiomegaly. 5. Age indeterminate T11 compression fracture with greater than 50% loss of vertebral body height and  anterior wedging. Correlation with clinical exam and point tenderness recommended. 6. Aortic Atherosclerosis (ICD10-I70.0). These results were called by telephone at the time of interpretation on 06/29/2020 at 11:47 pm to provider TIMOTHY OPYD , who verbally acknowledged these results. Electronically Signed   By: Elgie Collard M.D.   On: 06/29/2020 23:56   DG Chest Portable 1 View  Result Date: 06/29/2020 CLINICAL DATA:  69 year old female with concern for sepsis. EXAM: PORTABLE CHEST 1 VIEW COMPARISON:  Chest radiograph dated 06/24/2009. FINDINGS: Evaluation is limited due to calcified breast implants and portable technique. There is an area of increased opacity in the right upper lobe which may represent asymmetric edema but concerning for developing infiltrate. Clinical correlation is recommended. Right perihilar density, new since the prior radiograph and may represent hilar/mediastinal adenopathy or mass. Further evaluation with CT is recommended. No large pleural effusion. No pneumothorax. There is mild cardiomegaly. Median sternotomy wires and left pectoral pacemaker device. No acute osseous pathology. IMPRESSION: 1. Right upper lobe opacity concerning for developing infiltrate. 2. Right hilar/mediastinal adenopathy or mass. Further evaluation with CT is recommended. Electronically Signed   By: Elgie Collard M.D.   On: 06/29/2020 21:13    EKG: Normal sinus rhythm with ventricular pacing  Weights: Filed Weights   07/01/20 1039 07/02/20 0343 07/03/20 0339  Weight: 53.1 kg 58.2 kg 57 kg     Physical Exam: Blood pressure 99/67, pulse 74, temperature 97.8 F (36.6 C), temperature source Oral, resp. rate 17, height 5\' 2"  (1.575 m), weight 57 kg, SpO2 100 %. Body mass index is 22.99 kg/m. General: Well developed, well nourished, in no acute distress. Head eyes ears nose throat: Normocephalic, atraumatic, sclera non-icteric, no xanthomas, nares are without discharge. No apparent  thyromegaly and/or mass  Lungs: Normal respiratory effort.  no wheezes, no rales, no rhonchi.  Heart: RRR with normal S1 MECHANICAL S2.  4+ right upper sternal border murmur gallop, no rub, PMI is normal size and placement, carotid upstroke normal with  bruit, jugular venous pressure is normal Abdomen: Soft, non-tender, non-distended with normoactive bowel sounds. No hepatomegaly. No rebound/guarding. No obvious abdominal masses. Abdominal aorta is normal size without bruit Extremities: 1+ edema. no cyanosis, no clubbing, no ulcers  Peripheral : 2+ bilateral upper extremity pulses, 2+ bilateral femoral pulses, 2+ bilateral dorsal pedal pulse Neuro: Alert and oriented. No facial asymmetry. No focal deficit. Moves all extremities spontaneously. Musculoskeletal: Normal muscle tone without kyphosis Psych:  Responds to questions appropriately with a normal affect.    Assessment: 69 year old female with known aortic valve stenosis status post Saint Jude aortic valve replacement dual-chamber  pacemaker status post second-degree type II AV block hypertension hyperlipidemia with right upper lobe pneumonia completely treated over the last week with E. coli in the urine also completely treated and no evidence of endocarditis with incidental finding of type a aortic aneurysm and dissection appears to be stable with new onset anemia of unknown etiology needing further evaluation including colonoscopy and endoscopy for which she currently is stable without heart failure, endocarditis, anginal symptoms.  She is at lowest risk possible for cardiovascular complication with this procedure  Plan: 1.  Proceed to endoscopy colonoscopy for further evaluation of anemia and potential sources with also continued treatment without restriction 2.  Continuation of beta-blocker for heart rate and blood pressure control without change at this time 3.  Continue antibiotics for E. coli urinary tract infection and right upper lobe  pneumonia for 24 more hours throughout colonoscopy and endoscopy for further risk reduction of endocarditis or any other complication 4.  No further cardiac diagnostics necessary at this time due to stability of above 5.  Reinstatement of warfarin when stable as per above with INR between 2 and 3 6.  Further evaluation treatment options of aortic aneurysm and/or dissection as outpatient as per above  Signed, Lamar Blinks M.D. [facility] 07/04/2020, 5:58 AM

## 2020-07-04 NOTE — Progress Notes (Signed)
EGD revealed large hiatal hernia with hernial sac containing erosions Colonoscopy was normal Likely source of slow blood loss from lesions in the hernial sac in addition to anemia of chronic disease  Recommendations Antireflux lifestyle Prilosec 40 mg p.o. twice daily indefinitely due to increased risk for bleeding on anticoagulation, in setting of large hiatal hernia Patient can follow-up with GI as outpatient Okay to restart anticoagulation Advance diet as tolerated GI will sign off, please call us back with questions or concerns  Arlyss Repress, MD [address]  [address]  [place], [place] [address]  Main: [phone]  Fax: [phone] Pager: [phone]

## 2020-07-04 NOTE — Anesthesia Procedure Notes (Signed)
Date/Time: 07/04/2020 8:55 AM Performed by: Stormy Fabian, CRNA Pre-anesthesia Checklist: Patient identified, Emergency Drugs available, Suction available and Patient being monitored Patient Re-evaluated:Patient Re-evaluated prior to induction Oxygen Delivery Method: Nasal cannula Induction Type: IV induction Dental Injury: Teeth and Oropharynx as per pre-operative assessment  Comments: Nasal cannula with etCO2 monitoring

## 2020-07-04 NOTE — Anesthesia Postprocedure Evaluation (Signed)
Anesthesia Post Note  Patient: Shelby Werner  Procedure(s) Performed: ESOPHAGOGASTRODUODENOSCOPY (EGD) WITH PROPOFOL (N/A ) COLONOSCOPY WITH PROPOFOL (N/A )  Patient location during evaluation: Endoscopy Anesthesia Type: General Level of consciousness: awake and alert Pain management: pain level controlled Vital Signs Assessment: post-procedure vital signs reviewed and stable Respiratory status: spontaneous breathing, nonlabored ventilation, respiratory function stable and patient connected to nasal cannula oxygen Cardiovascular status: blood pressure returned to baseline and stable Postop Assessment: no apparent nausea or vomiting Anesthetic complications: no   No complications documented.   Last Vitals:  Vitals:   07/04/20 1021 07/04/20 1150  BP: 97/64 109/72  Pulse:  71  Resp:  16  Temp:  36.9 C  SpO2:  100%    Last Pain:  Vitals:   07/04/20 0837  TempSrc: Oral  PainSc:                  Lenard Simmer

## 2020-07-04 NOTE — Consult Note (Addendum)
ANTICOAGULATION CONSULT NOTE   Pharmacy Consult for Warfarin  Indication: mechanical aortic valve (St. Jude AVR in 1991) Warfarin 5 mg daily (last reported dose was 12/19 per patient's spouse)  No Known Allergies   Labs: Recent Labs    07/01/20 2257 07/02/20 0804 07/02/20 0804 07/02/20 1627 07/02/20 2143 07/03/20 0547 07/03/20 0958 07/03/20 1528 07/03/20 2146 07/04/20 0448  HGB  --  8.9*   < > 9.0*   < > 8.1*   < > 8.9* 9.1* 8.4*  HCT  --  27.2*   < > 28.0*   < > 23.4*   < > 27.4* 29.1* 25.4*  PLT  --  130*  --   --   --  136*  --   --   --  166  LABPROT  --  17.2*  --   --   --   --   --   --   --  19.4*  INR  --  1.5*  --   --   --   --   --   --   --  1.7*  HEPARINUNFRC 0.12* 0.41  --  <0.10*  --   --   --   --   --   --   CREATININE  --  0.56  --   --   --  0.61  --   --   --  0.59   < > = values in this interval not displayed.    Estimated Creatinine Clearance: 51.8 mL/min (by C-G formula based on SCr of 0.59 mg/dL).   Medications:  Warfarin 5 mg daily - patient is currently unable to confirm dose. However, per clinic note on 11/23 patient was taking warfarin 5 mg daily. Last warfarin dose was 12/23 (7.5 mg ) @ 1823. Per GI note today, it is okay to resume warfarin.   Date       INR          Dose         Comment 12/26     1.7            -----   Today's INR is subtherapeutic.   Assessment: Pharmacy has been consulted for warfarin dosing. She had GI today for an endoscopy.   Goal of Therapy:  INR 2.5-3.5 Monitor platelets by anticoagulation protocol: Yes   Plan:  Will order warfarin 7.5 mg x1 dose. Will check INR/CBC with AM labs.     Katha Cabal 07/04/2020,2:40 PM

## 2020-07-04 NOTE — Plan of Care (Signed)
  Problem: Clinical Measurements: Goal: Will remain free from infection Outcome: Progressing   Problem: Activity: Goal: Risk for activity intolerance will decrease Outcome: Progressing   Problem: Safety: Goal: Ability to remain free from injury will improve Outcome: Progressing   

## 2020-07-04 NOTE — Anesthesia Preprocedure Evaluation (Addendum)
Anesthesia Evaluation  Patient identified by MRN, date of birth, ID band Patient awake    Reviewed: Allergy & Precautions, H&P , NPO status , Patient's Chart, lab work & pertinent test results, reviewed documented beta blocker date and time   History of Anesthesia Complications Negative for: history of anesthetic complications  Airway Mallampati: II  TM Distance: >3 FB Neck ROM: full    Dental  (+) Dental Advidsory Given, Poor Dentition   Pulmonary shortness of breath and with exertion, pneumonia, neg COPD, neg recent URI, former smoker,    Pulmonary exam normal breath sounds clear to auscultation       Cardiovascular Exercise Tolerance: Good hypertension, (-) angina(-) Past MI and (-) Cardiac Stents Normal cardiovascular exam+ dysrhythmias + pacemaker + Valvular Problems/Murmurs AS  Rhythm:regular Rate:Normal     Neuro/Psych PSYCHIATRIC DISORDERS Dementia negative neurological ROS     GI/Hepatic negative GI ROS, Neg liver ROS,   Endo/Other  negative endocrine ROS  Renal/GU negative Renal ROS  negative genitourinary   Musculoskeletal   Abdominal   Peds  Hematology negative hematology ROS (+)   Anesthesia Other Findings Past Medical History: No date: Hypertension  Patient also incidentally found to have a type A aortic aneurysm and dissection.  Cardiology was consulted given her history of AVR and pacemake and new finding of this dissection.  They stated that the patient is stable and optimized for her EGD at this time.  She will be seen as an outpatient for the aortic dissection.  She has most recently had an ECHO in September of 2021, which showed an EF of >55%, moderate LVH, and moderate MV regurgitation.  Reproductive/Obstetrics negative OB ROS                            Anesthesia Physical Anesthesia Plan  ASA: III  Anesthesia Plan: General   Post-op Pain Management:    Induction:  Intravenous  PONV Risk Score and Plan: 3 and Propofol infusion and TIVA  Airway Management Planned: Natural Airway and Nasal Cannula  Additional Equipment:   Intra-op Plan:   Post-operative Plan:   Informed Consent: I have reviewed the patients History and Physical, chart, labs and discussed the procedure including the risks, benefits and alternatives for the proposed anesthesia with the patient or authorized representative who has indicated his/her understanding and acceptance.     Dental Advisory Given  Plan Discussed with: Anesthesiologist, CRNA and Surgeon  Anesthesia Plan Comments: (Patient has had cardiac clearance in the setting of an incidental finding of a new type A aortic aneurysm and dissection, so we will proceed today while carefully monitoring the BP as to avoid worsening of the dissection.)       Anesthesia Quick Evaluation

## 2020-07-04 NOTE — Op Note (Addendum)
[facility] Patient Name: Shelby Werner Procedure Date: 07/04/2020 8:50 AM MRN: [id number] Account #: [id number] Date of Birth: 06-21-1951 Admit Type: Outpatient Age: 69 Room: [facility] ENDO ROOM 4 Gender: Female Note Status: Finalized Procedure:             Upper GI endoscopy Indications:           Anemia Providers:             Toney Reil MD, MD Medicines:             General Anesthesia Complications:         No immediate complications. Estimated blood loss: None. Procedure:             Pre-Anesthesia Assessment:                        - Prior to the procedure, a History and Physical was                         performed, and patient medications and allergies were                         reviewed. The patient is unable to give consent                         secondary to the patient being legally incompetent to                         consent. The risks and benefits of the procedure and                         the sedation options and risks were discussed with the                         patient's spouse. All questions were answered and                         informed consent was obtained. Patient identification                         and proposed procedure were verified by the physician,                         the nurse, the anesthesiologist, the anesthetist and                         the technician in the pre-procedure area in the                         procedure room in the endoscopy suite. Mental Status                         Examination: alert and oriented. Airway Examination:                         normal oropharyngeal airway and neck mobility.                         Respiratory Examination: clear to  auscultation. CV                         Examination: status post pacemaker / automatic                         defibrillator placement. Prophylactic Antibiotics: The                         patient does not require prophylactic  antibiotics.                         Prior Anticoagulants: The patient has taken Coumadin                         (warfarin), last dose was 7 days prior to procedure.                         ASA Grade Assessment: III - A patient with severe                         systemic disease. After reviewing the risks and                         benefits, the patient was deemed in satisfactory                         condition to undergo the procedure. The anesthesia                         plan was to use general anesthesia. Immediately prior                         to administration of medications, the patient was                         re-assessed for adequacy to receive sedatives. The                         heart rate, respiratory rate, oxygen saturations,                         blood pressure, adequacy of pulmonary ventilation, and                         response to care were monitored throughout the                         procedure. The physical status of the patient was                         re-assessed after the procedure.                        After obtaining informed consent, the endoscope was                         passed under direct vision. Throughout the procedure,  the patient's blood pressure, pulse, and oxygen                         saturations were monitored continuously. The Endoscope                         was introduced through the mouth, and advanced to the                         second part of duodenum. The upper GI endoscopy was                         accomplished without difficulty. The patient tolerated                         the procedure well. Findings:      The duodenal bulb and second portion of the duodenum were normal.      A large hiatal hernia was found. The proximal extent of the gastric       folds (end of tubular esophagus) was 40 cm from the incisors. The hiatal       narrowing was 47 cm from the incisors. The Z-line was 40 cm  from the       incisors.      LA Grade C (one or more mucosal breaks continuous between tops of 2 or       more mucosal folds, less than 75% circumference) esophagitis with no       bleeding was found in the lower third of the esophagus. Impression:            - Normal duodenal bulb and second portion of the                         duodenum.                        - Large hiatal hernia.                        - LA Grade C reflux esophagitis with no bleeding.                        - No specimens collected. Recommendation:        - Follow an antireflux regimen indefinitely.                        - Use Prilosec (omeprazole) 20 mg PO BID indefinitely.                        - Proceed with colonoscopy as scheduled                        See colonoscopy report Procedure Code(s):     --- Professional ---                        [phone], Esophagogastroduodenoscopy, flexible,                         transoral; diagnostic, including collection of  specimen(s) by brushing or washing, when performed                         (separate procedure) Diagnosis Code(s):     --- Professional ---                        K44.9, Diaphragmatic hernia without obstruction or                         gangrene                        K21.00, Gastro-esophageal reflux disease with                         esophagitis, without bleeding                        D64.9, Anemia, unspecified CPT copyright 2019 [employer]. All rights reserved. The codes documented in this report are preliminary and upon coder review may  be revised to meet current compliance requirements. Dr. Libby Maw Toney Reil MD, MD 07/04/2020 9:15:09 AM This report has been signed electronically. Number of Addenda: 0 Note Initiated On: 07/04/2020 8:50 AM Estimated Blood Loss:  Estimated blood loss: none.      [facility]

## 2020-07-04 NOTE — Progress Notes (Signed)
Give hand off to [facility]

## 2020-07-05 ENCOUNTER — Encounter: Payer: Self-pay | Admitting: Gastroenterology

## 2020-07-05 ENCOUNTER — Inpatient Hospital Stay: Payer: Medicare Other

## 2020-07-05 DIAGNOSIS — J189 Pneumonia, unspecified organism: Secondary | ICD-10-CM | POA: Diagnosis not present

## 2020-07-05 DIAGNOSIS — E43 Unspecified severe protein-calorie malnutrition: Secondary | ICD-10-CM | POA: Diagnosis not present

## 2020-07-05 DIAGNOSIS — D500 Iron deficiency anemia secondary to blood loss (chronic): Secondary | ICD-10-CM | POA: Diagnosis not present

## 2020-07-05 LAB — CBC
HCT: 24.5 % — ABNORMAL LOW (ref 36.0–46.0)
Hemoglobin: 8.1 g/dL — ABNORMAL LOW (ref 12.0–15.0)
MCH: 30.3 pg (ref 26.0–34.0)
MCHC: 33.1 g/dL (ref 30.0–36.0)
MCV: 91.8 fL (ref 80.0–100.0)
Platelets: 190 10*3/uL (ref 150–400)
RBC: 2.67 MIL/uL — ABNORMAL LOW (ref 3.87–5.11)
RDW: 15.6 % — ABNORMAL HIGH (ref 11.5–15.5)
WBC: 12 10*3/uL — ABNORMAL HIGH (ref 4.0–10.5)
nRBC: 0 % (ref 0.0–0.2)

## 2020-07-05 LAB — RENAL FUNCTION PANEL
Albumin: 2.5 g/dL — ABNORMAL LOW (ref 3.5–5.0)
Anion gap: 8 (ref 5–15)
BUN: 7 mg/dL — ABNORMAL LOW (ref 8–23)
CO2: 17 mmol/L — ABNORMAL LOW (ref 22–32)
Calcium: 7.7 mg/dL — ABNORMAL LOW (ref 8.9–10.3)
Chloride: 101 mmol/L (ref 98–111)
Creatinine, Ser: 0.63 mg/dL (ref 0.44–1.00)
GFR, Estimated: >60 mL/min (ref >60)
Glucose, Bld: 116 mg/dL — ABNORMAL HIGH (ref 70–99)
Phosphorus: 3 mg/dL (ref 2.5–4.6)
Potassium: 3.8 mmol/L (ref 3.5–5.1)
Sodium: 126 mmol/L — ABNORMAL LOW (ref 135–145)

## 2020-07-05 LAB — SODIUM
Sodium: 126 mmol/L — ABNORMAL LOW (ref 135–145)
Sodium: 129 mmol/L — ABNORMAL LOW (ref 135–145)
Sodium: 129 mmol/L — ABNORMAL LOW (ref 135–145)

## 2020-07-05 LAB — HEMOGLOBIN AND HEMATOCRIT, BLOOD
HCT: 25.7 % — ABNORMAL LOW (ref 36.0–46.0)
HCT: 25.9 % — ABNORMAL LOW (ref 36.0–46.0)
Hemoglobin: 8.7 g/dL — ABNORMAL LOW (ref 12.0–15.0)
Hemoglobin: 8.6 g/dL — ABNORMAL LOW (ref 12.0–15.0)

## 2020-07-05 LAB — MAGNESIUM: Magnesium: 2 mg/dL (ref 1.7–2.4)

## 2020-07-05 LAB — PROTIME-INR
INR: 1.7 — ABNORMAL HIGH (ref 0.8–1.2)
Prothrombin Time: 19.6 seconds — ABNORMAL HIGH (ref 11.4–15.2)

## 2020-07-05 LAB — TSH: TSH: 2.941 u[IU]/mL (ref 0.350–4.500)

## 2020-07-05 LAB — OSMOLALITY: Osmolality: 265 mOsm/kg — ABNORMAL LOW (ref 275–295)

## 2020-07-05 MED ORDER — WARFARIN SODIUM 5 MG PO TABS
5.0000 mg | ORAL_TABLET | Freq: Once | ORAL | Status: AC
  Administered 2020-07-05: 15:00:00 5 mg via ORAL
  Filled 2020-07-05: qty 1

## 2020-07-05 MED ORDER — FUROSEMIDE 10 MG/ML IJ SOLN
20.0000 mg | Freq: Once | INTRAMUSCULAR | Status: AC
  Administered 2020-07-05: 15:00:00 20 mg via INTRAVENOUS
  Filled 2020-07-05: qty 2

## 2020-07-05 MED ORDER — FUROSEMIDE 20 MG PO TABS
20.0000 mg | ORAL_TABLET | Freq: Every day | ORAL | Status: DC
  Administered 2020-07-05 – 2020-07-06 (×2): 20 mg via ORAL
  Filled 2020-07-05 (×2): qty 1

## 2020-07-05 NOTE — Consult Note (Signed)
PHARMACY CONSULT NOTE - FOLLOW UP  Pharmacy Consult for Electrolyte Monitoring and Replacement   Recent Labs: Potassium (mmol/L)  Date Value  07/05/2020 3.8   Magnesium (mg/dL)  Date Value  82/95/6213 2.0   Calcium (mg/dL)  Date Value  08/65/7846 7.7 (L)   Albumin (g/dL)  Date Value  96/29/5284 2.5 (L)   Phosphorus (mg/dL)  Date Value  13/24/4010 3.0   Sodium (mmol/L)  Date Value  07/05/2020 126 (L)     Assessment: Pharmacy has been consulted for electrolyte management. 69 y.o.femalewith medical history significant foraortic valve disease status post replacement in 1991 on warfarin, status post pacemaker and ICD, history of CVA, dementia, and chronic diastolic CHF, now presenting to the emergency department for evaluation of cough, lethargy, increased confusion.  Goal of Therapy:  Electrolytes WNL.   Plan:   No need for replacement at this time.   Will check electrolytes tomorrow with AM labs.   Ronnald Ramp ,PharmD Clinical Pharmacist 07/05/2020 8:13 AM

## 2020-07-05 NOTE — Care Management Important Message (Signed)
Important Message  Patient Details  Name: Shelby Werner MRN: [id number] Date of Birth: [birth year]   Medicare Important Message Given:  Yes     Johnell Comings 07/05/2020, 12:21 PM

## 2020-07-05 NOTE — Consult Note (Signed)
ANTICOAGULATION CONSULT NOTE   Pharmacy Consult for Warfarin  Indication: mechanical aortic valve ([employer] AVR in 1991) Warfarin 5 mg daily (last reported dose was 12/19 per patient's spouse)  No Known Allergies   Labs: Recent Labs    07/02/20 1627 07/02/20 2143 07/03/20 0547 07/03/20 0958 07/04/20 0448 07/04/20 1524 07/04/20 2131 07/05/20 0320  HGB 9.0*   < > 8.1*   < > 8.4* 8.1* 8.4* 8.1*  HCT 28.0*   < > 23.4*   < > 25.4* 24.6* 25.5* 24.5*  PLT  --   --  136*  --  166  --   --  190  LABPROT  --   --   --   --  19.4*  --   --  19.6*  INR  --   --   --   --  1.7*  --   --  1.7*  HEPARINUNFRC <0.10*  --   --   --   --   --   --   --   CREATININE  --   --  0.61  --  0.59  --   --  0.63   < > = values in this interval not displayed.    Estimated Creatinine Clearance: 51.8 mL/min (by C-G formula based on SCr of 0.63 mg/dL).   Medications:  Warfarin 5 mg daily - patient is currently unable to confirm dose. However, per clinic note on 11/23 patient was taking warfarin 5 mg daily. Last warfarin dose was 12/23 (7.5 mg ) @ 1823. Per GI note today, it is okay to resume warfarin. DDI on ceftriaxone. No bridge needed per cardiology.   Assessment: Pharmacy has been consulted for warfarin dosing. She had GI today for an endoscopy.   Date INR Dose         Comment 12/26 1.7 7.5 mg 12/27 1.7 5 mg  Goal of Therapy:  INR 2.5-3.5 Monitor platelets by anticoagulation protocol: Yes   Plan:  INR subtherapeutic. Will order warfarin 5 mg x1 dose (home dose). Pt received 50% increased dose from home dose yesterday. Predict INR will jump tomorrow as pt is also on abx. Daily INR ordered. Check CBC every 3 days.     Ronnald Ramp, PharmD, BCPS 07/05/2020,8:16 AM

## 2020-07-05 NOTE — Progress Notes (Addendum)
PROGRESS NOTE    Shelby Werner   ZOX:[id number]  DOB: 31-Jul-1950  PCP: Gracelyn Nurse, MD   Code Status: Full Code    DOA: 06/29/2020   LOS: 6   ________________________________________  Attending MD Note:  I have seen and examined the patient with the resident and agree with the note below which has been edited to reflect our agreed upon history, exam, and assessment/plan.    I have personally reviewed the orders for the patient, which were made under my direction.     Afternoon sodium improved after IV Lasix, so is hypervolemic etiology.  Pt had increased WOB this AM and some LE edema as well.  Expect sodium to continue improving with resuming home PO Lasix.  PT and OT to evaluate for d/c planning.   Pennie Banter  ________________________________________   Brief Narrative of Current Hospitalization   Shelby Werner is a 69 y.o. female with a PMH significant for mechanical aortic valve replacement 1991 on warfarin, HTN, HLD, dual-chamber pacemaker, HFrEF. They presented from home to the ED on 06/29/2020 with respiratory symptoms and lethargy. In the ED, it was found that they had RUL PNA and meeting criteria for sepsis. They were treated with IV fluids, CTX, azithromycin, zosyn, vanc.  Patient was admitted to medicine service for further workup and management of sepsis as outlined in detail below. Additionally, during hospital stay, patient was thought to have active GI bleed and was seen by GI.   07/05/20 - respiratory status is stable on 2L, baseline ORA. hgb is stable at 8.1 from 8.4 yesterday. Hyponatremia is downtrending. INR is sub-therapeutic. Due to these reasons, patient will need continued management today and can reassess this afternoon as she really wants to discharge home.  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 2Lnc today and no complaints of respiratory distress. Afebrile. BCx neg x5 days. UCx showing pan-sensitive e coli. COVID neg. Patient appears to be having harder time to breath today than previous, per attending report. Repeat chest xray showed no worsening from previous imaging. - wean to room air if tolerated - finish azithromycin, started 12/22 (day 5/5) - finish CTX, started 12/22 (day 5/5) - appreciate PT/OT recs and care - start lasix IV  Acute anemia- colonoscopy and endoscopy 12/26 without acute bleed identified. Transfusion threshold 8. hgb 8.1 today. Iron studies 12/23 showed low iron and TIBC.  - CBC am  Aortic aneurysm and dissection- stable - appreciate cards recs and care, they have s/o with op f/u  Mechanical aortic valve- chronic and stable. INR 1.7 today. Gaol 2.5-3.5 - appreciate pharm recs and care, adjusting warfarin for therapeutic INR range - INR am  HTN- chronic and stable - continue home atenolol  daily  Hyponatremia- continues to down-trend today- 130>127>126>126.  - repeat Na now and this afternoon.  - urine Na, osmols - Trial of  IV lasix today.  - strict I/Os  Psych- restless leg syndrome, anxiety. Follows with psychiatry op. - continue home requip, trazodone, and nortiptyline  Patient BMI: Body mass index is 23.1 kg/m.  DVT prophylaxis: SCDs   Diet:  Diet Orders (From admission, onward)    Start     Ordered   07/04/20 0943  Diet Heart Room service appropriate? Yes; Fluid consistency: Thin  Diet effective now       Question Answer Comment  Room service appropriate? Yes   Fluid consistency: Thin      07/04/20 0942          Subjective 07/05/20    Pt reports she is feeling better  from a respiratory stand point. She would really like to go home. We will check her labs again and revisit this afternoon.  Disposition Plan & Communication   Status is: Inpatient  Remains inpatient appropriate because:Persistent severe electrolyte disturbances and Inpatient level of care appropriate due to severity of illness  PT and OT evaluations pending, pt generally weak and has 14 stairs (or longer walk across yard) to enter her home.  Dispo:  Patient From: Home  Planned Disposition: Home  Expected discharge date: 07/06/2020  Medically stable for discharge: No     Family Communication: husband in room   Consults, Procedures, Significant Events   Consultants:   GI, cardiology  Procedures/significant events:   Upper and lower endoscopy 12/26  Antimicrobials:  Anti-infectives (From admission, onward)   Start     Dose/Rate Route Frequency Ordered Stop   07/04/20 1400  cefTRIAXone (ROCEPHIN) 1 g in sodium chloride 0.9 % 100 mL IVPB        1 g 200 mL/hr over 30 Minutes Intravenous Every 24 hours 07/04/20 1328     06/30/20 0900  azithromycin (ZITHROMAX) 500 mg in sodium chloride 0.9 % 250 mL IVPB        500 mg 250 mL/hr over 60 Minutes Intravenous Every 24 hours 06/29/20 2227 07/04/20 0930   06/30/20 0800  cefTRIAXone (ROCEPHIN) 2 g in sodium chloride 0.9 % 100 mL IVPB  Status:  Discontinued        2 g 200 mL/hr over 30 Minutes Intravenous Every 24 hours 06/29/20 2227 07/04/20 1329   06/29/20 2130  vancomycin (VANCOCIN) IVPB 1000 mg/200 mL premix        1,000 mg 200 mL/hr over 60 Minutes Intravenous  Once 06/29/20 2116 06/29/20 2228   06/29/20 2100  piperacillin-tazobactam (ZOSYN) IVPB 3.375 g        3.375 g 100 mL/hr over 30 Minutes Intravenous  Once 06/29/20 2054 06/29/20 2225       Objective   Vitals:   07/04/20 2035 07/05/20 0413 07/05/20 0419 07/05/20 0745  BP: 99/61  117/80 116/67  Pulse: 83  (!) 102 93  Resp: 18  20   Temp: 97.8 F (36.6 C)  (!) 97.4  F (36.3 C) 98 F (36.7 C)  TempSrc: Oral  Oral Oral  SpO2: 100%  100% 96%  Weight:  57.3 kg    Height:        Intake/Output Summary (Last 24 hours) at 07/05/2020 1007 Last data filed at 07/04/2020 2250 Gross per 24 hour  Intake 740 ml  Output 4 ml  Net 736 ml   Filed Weights   07/02/20 0343 07/03/20 0339 07/05/20 0413  Weight: 58.2 kg 57 kg 57.3 kg    Physical Exam:  General: awake, alert, NAD HEENT: atraumatic, clear conjunctiva, anicteric sclera,moist mucus membranes, hearing grossly normal, negative cervical lymphadenopathy or thyromegaly  Respiratory system: no wheezes, rales. mild rhonchi at bases, mildly increased respiratory effort at rest. Cardiovascular system: normal S1/S2, RRR, no JVD, murmurs  Gastrointestinal system: soft, NT, ND, no HSM felt, +bowel sounds. Central nervous system: A&O x4. no gross focal neurologic deficits, normal speech Extremities: moves all equally, 1+ edema LE, normal tone Skin: dry, intact, normal temperature, normal color, No rashes, lesions or ulcers Psychiatry: normal mood, congruent affect, judgement and insight appear normal  Labs   Data Reviewed: I have personally reviewed following labs and imaging studies  CBC: Recent Labs  Lab 06/29/20 1725 06/30/20 0626 07/01/20 0451 07/02/20 0804 07/02/20 1627 07/03/20 0547 07/03/20 0958 07/03/20 2146 07/04/20 0448 07/04/20 1524 07/04/20 2131 07/05/20 0320  WBC 21.8*   < > 16.1* 15.1*  --  11.9*  --   --  14.3*  --   --  12.0*  NEUTROABS 20.3*  --   --   --   --   --   --   --   --   --   --   --   HGB 10.3*   < > 9.4* 8.9*   < > 8.1*   < > 9.1* 8.4* 8.1* 8.4* 8.1*  HCT 31.0*   < > 27.7* 27.2*   < > 23.4*   < > 29.1* 25.4* 24.6* 25.5* 24.5*  MCV 92.5   < > 91.4 93.2  --  90.3  --   --  92.0  --   --  91.8  PLT 128*   < > 126* 130*  --  136*  --   --  166  --   --  190   < > = values in this interval not displayed.   Basic Metabolic Panel: Recent Labs  Lab 06/30/20 0626  07/01/20 0451 07/02/20 0804 07/03/20 0547 07/04/20 0448 07/05/20 0320  NA 134* 133* 131* 130* 127* 126*  K 3.4* 2.9* 3.4* 3.4* 4.1 3.8  CL 98 102 103 105 102 101  CO2 27 23 20* 18* 18* 17*  GLUCOSE 100* 109* 111* 119* 124* 116*  BUN 19 17 13 9 8  7*  CREATININE 0.76 0.65 0.56 0.61 0.59 0.63  CALCIUM 8.2* 7.8* 7.8* 7.7* 7.8* 7.7*  MG 2.1 2.0  --   --  2.4 2.0  PHOS  --   --   --   --  1.3* 3.0   GFR: Estimated Creatinine Clearance: 51.8 mL/min (by C-G formula based on SCr of 0.63 mg/dL). Liver Function Tests: Recent Labs  Lab 06/29/20 1725 07/03/20 0547 07/05/20 0320  AST 23 19  --   ALT 13 14  --   ALKPHOS 80 53  --   BILITOT 1.2 0.5  --   PROT 6.7 5.4*  --   ALBUMIN 3.5 2.5* 2.5*   No results for input(s): LIPASE, AMYLASE in the last 168 hours. No results for input(s): AMMONIA in the last 168 hours. Coagulation Profile: Recent Labs  Lab 06/30/20 0626 07/01/20 0451 07/02/20 0804 07/04/20 0448 07/05/20 0320  INR 2.1* 1.5* 1.5* 1.7* 1.7*   Cardiac Enzymes: No results for input(s): CKTOTAL, CKMB, CKMBINDEX, TROPONINI in the last 168 hours. BNP (last 3 results) No results for input(s): PROBNP in the last 8760 hours. HbA1C: No results for input(s): HGBA1C in the last 72 hours. CBG: Recent Labs  Lab 06/29/20 1724  GLUCAP 107*   Lipid Profile: No results for input(s): CHOL, HDL, LDLCALC, TRIG, CHOLHDL, LDLDIRECT in the last 72 hours. Thyroid Function Tests: No results for input(s): TSH, T4TOTAL, FREET4, T3FREE, THYROIDAB in the last 72 hours. Anemia Panel: No results for input(s): VITAMINB12, FOLATE, FERRITIN, TIBC,  IRON, RETICCTPCT in the last 72 hours. Sepsis Labs: Recent Labs  Lab 06/29/20 1740 06/29/20 1945 06/29/20 2310 06/30/20 0256 06/30/20 0626 07/01/20 0451  PROCALCITON  --   --   --  11.82 12.22 5.96  LATICACIDVEN 2.3* 2.3* 2.6*  --   --  1.4    Recent Results (from the past 240 hour(s))  Culture, blood (Routine X 2) w Reflex to ID Panel      Status: None   Collection Time: 06/29/20  7:45 PM   Specimen: BLOOD  Result Value Ref Range Status   Specimen Description BLOOD RIGHT ANTECUBITAL  Final   Special Requests   Final    BOTTLES DRAWN AEROBIC AND ANAEROBIC Blood Culture adequate volume   Culture   Final    NO GROWTH 5 DAYS Performed at [facility], [address] [place], [place] [address]    Report Status 07/04/2020 FINAL  Final  Urine Culture     Status: Abnormal   Collection Time: 06/29/20  8:33 PM   Specimen: Urine, Random  Result Value Ref Range Status   Specimen Description   Final    URINE, RANDOM Performed at [facility], [address] [place], [place] [address]    Special Requests   Final    NONE Performed at [facility], [address] [place], [place] [address]    Culture 20,000 COLONIES/mL ESCHERICHIA COLI (A)  Final   Report Status 07/01/2020 FINAL  Final   Organism ID, Bacteria ESCHERICHIA COLI (A)  Final      Susceptibility   Escherichia coli - MIC*    AMPICILLIN 4 SENSITIVE Sensitive     CEFAZOLIN <=4 SENSITIVE Sensitive     CEFEPIME <=0.12 SENSITIVE Sensitive     CEFTRIAXONE <=0.25 SENSITIVE Sensitive     CIPROFLOXACIN <=0.25 SENSITIVE Sensitive     GENTAMICIN <=1 SENSITIVE Sensitive     IMIPENEM <=0.25 SENSITIVE Sensitive     NITROFURANTOIN <=16 SENSITIVE Sensitive     TRIMETH/SULFA <=20 SENSITIVE Sensitive     AMPICILLIN/SULBACTAM <=2 SENSITIVE Sensitive     PIP/TAZO <=4 SENSITIVE Sensitive     * 20,000 COLONIES/mL ESCHERICHIA COLI  Culture, blood (Routine X 2) w Reflex to ID Panel     Status: None   Collection Time: 06/29/20  8:35 PM   Specimen: BLOOD  Result Value Ref Range Status   Specimen Description BLOOD LEFT ANTECUBITAL  Final   Special Requests   Final    BOTTLES DRAWN AEROBIC AND ANAEROBIC Blood Culture results may not be optimal due to an excessive volume of blood received in culture bottles   Culture   Final    NO GROWTH 5  DAYS Performed at [facility], [address] [place], [place] [address]    Report Status 07/04/2020 FINAL  Final  Resp Panel by RT-PCR (Flu A&B, Covid) Nasopharyngeal Swab     Status: None   Collection Time: 06/29/20 11:13 PM   Specimen: Nasopharyngeal Swab; Nasopharyngeal(NP) swabs in vial transport medium  Result Value Ref Range Status   SARS Coronavirus 2 by RT PCR NEGATIVE NEGATIVE Final    Comment: (NOTE) SARS-CoV-2 target nucleic acids are NOT DETECTED.  The SARS-CoV-2 RNA is generally detectable in upper respiratory specimens during the acute phase of infection. The lowest concentration of SARS-CoV-2 viral copies this assay can detect is 138 copies/mL. A negative result does not preclude SARS-Cov-2 infection and should not be used as the sole basis for treatment or other patient  management decisions. A negative result may occur with  improper specimen collection/handling, submission of specimen other than nasopharyngeal swab, presence of viral mutation(s) within the areas targeted by this assay, and inadequate number of viral copies(<138 copies/mL). A negative result must be combined with clinical observations, patient history, and epidemiological information. The expected result is Negative.  Fact Sheet for Patients:  BloggerCourse.com  Fact Sheet for Healthcare Providers:  SeriousBroker.it  This test is no t yet approved or cleared by the [place] FDA and  has been authorized for detection and/or diagnosis of SARS-CoV-2 by FDA under an Emergency Use Authorization (EUA). This EUA will remain  in effect (meaning this test can be used) for the duration of the COVID-19 declaration under Section 564(b)(1) of the Act, 21 U.S.C.section 360bbb-3(b)(1), unless the authorization is terminated  or revoked sooner.       Influenza A by PCR NEGATIVE NEGATIVE Final   Influenza B by PCR NEGATIVE NEGATIVE Final     Comment: (NOTE) The Xpert Xpress SARS-CoV-2/FLU/RSV plus assay is intended as an aid in the diagnosis of influenza from Nasopharyngeal swab specimens and should not be used as a sole basis for treatment. Nasal washings and aspirates are unacceptable for Xpert Xpress SARS-CoV-2/FLU/RSV testing.  Fact Sheet for Patients: BloggerCourse.com  Fact Sheet for Healthcare Providers: SeriousBroker.it  This test is not yet approved or cleared by the [place] FDA and has been authorized for detection and/or diagnosis of SARS-CoV-2 by FDA under an Emergency Use Authorization (EUA). This EUA will remain in effect (meaning this test can be used) for the duration of the COVID-19 declaration under Section 564(b)(1) of the Act, 21 U.S.C. section 360bbb-3(b)(1), unless the authorization is terminated or revoked.  Performed at [facility], [address] [place], [place] [address]      Imaging Studies   No results found.  Medications   Scheduled Meds: . atenolol  100 mg Oral Daily  . chlorzoxazone  500 mg Oral Daily  . feeding supplement  237 mL Oral BID BM  . multivitamin with minerals  1 tablet Oral Daily  . nortriptyline  50 mg Oral QHS  . pantoprazole  40 mg Oral BID AC  . rOPINIRole  0.75 mg Oral QHS  . Warfarin - Pharmacist Dosing Inpatient   Does not apply q1600   Continuous Infusions: . sodium chloride Stopped (07/03/20 1310)  . cefTRIAXone (ROCEPHIN)  IV 1 g (07/04/20 1454)     LOS: 6 days    Time spent: 25 minutes with > 50% spent in coordination of care and at bedside   Leeroy Bock, DO PGY-3 FM resident    [facility] 07/05/2020, 10:07 AM    If 7PM-7AM, please contact night-coverage. How to contact the [facility] Attending or Consulting provider 7A - 7P or covering provider during after hours 7P -7A, for this patient?    1. Check the care team in [facility] and look for a) attending/consulting  [facility] provider listed and b) the [facility] team listed 2. Log into [email or web address] and use [place]'s universal password to access. If you do not have the password, please contact the hospital operator. 3. Locate the [facility] provider you are looking for under [facility] and page to a number that you can be directly reached. 4. If you still have difficulty reaching the provider, please page the [facility] (Director on Call) for the Hospitalists listed on amion for assistance.

## 2020-07-05 NOTE — Progress Notes (Signed)
[facility] ([facility] Cardiology [facility] Encounter Note  Patient: Shelby Werner / Admit Date: 06/29/2020 / Date of Encounter: 07/05/2020, 7:24 AM   Subjective: 12/27.  Patient overall feels better since yesterday and significantly improved from hospital admission.  The patient has had no evidence of PND orthopnea syncope dizziness nausea chest pain or diaphoresis at this time.  Patient tolerated colonoscopy and endoscopy evaluation for anemia and showed no evidence of significant source of bleeding.  The patient does have anemia which is likely multifactorial in nature.  Patient has recovered from infection with appropriate course of antibiotics.  No evidence of significant rhythm disturbances with dual-chamber pacemaker.  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  Does remain on oxygen today with 100% O2 saturation and may be able to discontinue today.  Review of Systems: Positive for: Weakness Negative for: Vision change, hearing change, syncope, dizziness, nausea, vomiting,diarrhea, bloody stool, stomach pain, cough, congestion, diaphoresis, urinary frequency, urinary pain,skin lesions, skin rashes Others previously listed  Objective: Telemetry: Normal sinus rhythm with ventricular pacing Physical Exam: Blood pressure 117/80, pulse (!) 102, temperature (!) 97.4 F (36.3 C), temperature source Oral, resp. rate 20, height 5\' 2"  (1.575 m), weight 57.3 kg, SpO2 100 %. Body mass index is 23.1 kg/m. General: Well developed, well nourished, in no acute distress. Head: Normocephalic, atraumatic, sclera non-icteric, no xanthomas, nares are without discharge. Neck: No apparent masses Lungs: Normal respirations with few wheezes, no rhonchi, no rales , no crackles   Heart: Regular rate and rhythm, normal  S1 MECHANICAL S2, 4+ right upper sternal border murmur, no rub, no gallop, PMI is normal size and placement, carotid upstroke normal with  bruit, jugular venous pressure normal Abdomen: Soft, non-tender, non-distended with normoactive bowel sounds. No hepatosplenomegaly. Abdominal aorta is normal size without bruit Extremities: Trace edema, no clubbing, no cyanosis, no ulcers,  Peripheral: 2+ radial, 2+ femoral, 2+ dorsal pedal pulses Neuro: Alert and oriented. Moves all extremities spontaneously. Psych:  Responds to questions appropriately with a normal affect.   Intake/Output Summary (Last 24 hours) at 07/05/2020 0724 Last data filed at 07/04/2020 2250 Gross per 24 hour  Intake 1140 ml  Output 4 ml  Net 1136 ml    Inpatient Medications:  . atenolol  100 mg Oral Daily  . chlorzoxazone  500 mg Oral Daily  . feeding supplement  237 mL Oral BID BM  . multivitamin with minerals  1 tablet Oral Daily  . nortriptyline  50 mg Oral QHS  . pantoprazole  40 mg Oral BID AC  . rOPINIRole  0.75 mg Oral QHS  . Warfarin - Pharmacist Dosing Inpatient   Does not apply q1600   Infusions:  . sodium chloride Stopped (07/03/20 1310)  . cefTRIAXone (ROCEPHIN)  IV 1 g (07/04/20 1454)    Labs: Recent Labs    07/04/20 0448 07/05/20 0320  NA 127* 126*  K 4.1 3.8  CL 102 101  CO2 18* 17*  GLUCOSE 124* 116*  BUN 8 7*  CREATININE 0.59 0.63  CALCIUM 7.8* 7.7*  MG 2.4 2.0  PHOS 1.3* 3.0   Recent Labs    07/03/20 0547 07/05/20 0320  AST 19  --   ALT 14  --   ALKPHOS 53  --   BILITOT 0.5  --   PROT  5.4*  --   ALBUMIN 2.5* 2.5*   Recent Labs    07/04/20 0448 07/04/20 1524 07/04/20 2131 07/05/20 0320  WBC 14.3*  --   --  12.0*  HGB 8.4*   < > 8.4* 8.1*  HCT 25.4*   < > 25.5* 24.5*  MCV 92.0  --   --  91.8  PLT 166  --   --  190   < > = values in this interval not displayed.   No results for input(s): CKTOTAL, CKMB, TROPONINI in the last 72 hours. Invalid input(s): POCBNP No  results for input(s): HGBA1C in the last 72 hours.   Weights: Filed Weights   07/02/20 0343 07/03/20 0339 07/05/20 0413  Weight: 58.2 kg 57 kg 57.3 kg     Radiology/Studies:  CT HEAD WO CONTRAST  Result Date: 06/29/2020 CLINICAL DATA:  Neuro deficit, acute stroke suspected. Patient has history of dementia. EXAM: CT HEAD WITHOUT CONTRAST TECHNIQUE: Contiguous axial images were obtained from the base of the skull through the vertex without intravenous contrast. COMPARISON:  CT head 11/29/2017 FINDINGS: Brain: Patchy and confluent areas of decreased attenuation are noted throughout the deep and periventricular white matter of the cerebral hemispheres bilaterally, compatible with chronic microvascular ischemic disease. Encephalomalacia of the right temporal and occipital lobes again noted. No evidence of large-territorial acute infarction. No parenchymal hemorrhage. No mass lesion. No extra-axial collection. No mass effect or midline shift. No hydrocephalus. Basilar cisterns are patent. Vascular: No hyperdense vessel. Skull: No acute fracture or focal lesion. Sinuses/Orbits: Paranasal sinuses and mastoid air cells are clear. The orbits are unremarkable. Other: None. IMPRESSION: No acute intracranial abnormality. Electronically Signed   By: Tish Frederickson M.D.   On: 06/29/2020 18:05   CT CHEST W CONTRAST  Result Date: 06/29/2020 CLINICAL DATA:  69 year old female with abnormal chest radiograph. EXAM: CT CHEST WITH CONTRAST TECHNIQUE: Multidetector CT imaging of the chest was performed during intravenous contrast administration. CONTRAST:  38mL OMNIPAQUE IOHEXOL 300 MG/ML  SOLN COMPARISON:  Earlier radiograph dated 06/29/2020. FINDINGS: Evaluation of this exam is limited due to respiratory motion artifact. Cardiovascular: There is moderate cardiomegaly. No pericardial effusion. There is focal area of calcification adjacent to the mechanical aortic valve. There is aneurysmal dilatation of the aortic root  and ascending aorta measuring up to 7.6 cm in AP diameter. There is a dissection flap origin ating from the root of the aorta and extending superiorly in the ascending aorta terminating in the proximal arch just proximal to the takeoff of the right brachiocephalic trunk. No periaortic fluid collection. No extraluminal contrast. There is a 1.8 x 2.4 cm focal blooming of the anterior wall of the aortic root (75/2). The origins of the great vessels of the aortic arch appear patent as visualized. The descending aorta is tortuous otherwise unremarkable. Evaluation of the pulmonary arteries is limited due to suboptimal opacification and timing of the contrast. A left pectoral pacemaker device noted. Mediastinum/Nodes: Mildly enlarged right hilar lymph nodes, likely reactive. Subcarinal lymph node measures 16 mm in short axis. The esophagus is grossly unremarkable. No mediastinal fluid collection. Lungs/Pleura: There is a large area of opacity in the right upper lobe most consistent with pneumonia. Clinical correlation and follow-up to resolution recommended. Additional scattered clusters of ground-glass opacity involving the right middle lobe and right lower lobe likely pneumonia or aspiration. The left lung remains clear. There are trace bilateral pleural effusions. No pneumothorax. The central airways are patent. Upper Abdomen: A 6.5 cm cyst in the  dome of the liver. Additional smaller hypodense lesions as well as a 12 mm enhancing nodule in the right lobe of the liver and a faint 2.2 x 2.1 cm enhancing lesion in the right lobe of the liver (145/2) are not characterized. Musculoskeletal: Bilateral breast implants with calcified capsule. There is osteopenia with degenerative changes of the spine. Age indeterminate T11 compression fracture with greater than 50% loss of vertebral body height and anterior wedging. Correlation with clinical exam and point tenderness recommended. Median sternotomy wires. IMPRESSION: 1. 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 There is a 1.8 x 2.4 cm focal blooming of the anterior wall of the aortic root. Vascular surgery consult is advised. No periaortic fluid collection or evidence of rupture. 2. Multifocal pneumonia predominantly involving the right upper lobe. 3. Trace bilateral pleural effusions. 4. Moderate cardiomegaly. 5. Age indeterminate T11 compression fracture with greater than 50% loss of vertebral body height and anterior wedging. Correlation with clinical exam and point tenderness recommended. 6. Aortic Atherosclerosis (ICD10-I70.0). These results were called by telephone at the time of interpretation on 06/29/2020 at 11:47 pm to provider TIMOTHY OPYD , who verbally acknowledged these results. Electronically Signed   By: Elgie Collard M.D.   On: 06/29/2020 23:56   DG Chest Portable 1 View  Result Date: 06/29/2020 CLINICAL DATA:  69 year old female with concern for sepsis. EXAM: PORTABLE CHEST 1 VIEW COMPARISON:  Chest radiograph dated 06/24/2009. FINDINGS: Evaluation is limited due to calcified breast implants and portable technique. There is an area of increased opacity in the right upper lobe which may represent asymmetric edema but concerning for developing infiltrate. Clinical correlation is recommended. Right perihilar density, new since the prior radiograph and may represent hilar/mediastinal adenopathy or mass. Further evaluation with CT is recommended. No large pleural effusion. No pneumothorax. There is mild cardiomegaly. Median sternotomy wires and left pectoral pacemaker device. No acute osseous pathology. IMPRESSION: 1. Right upper lobe opacity concerning for developing infiltrate. 2. Right hilar/mediastinal adenopathy or mass. Further evaluation with CT is recommended. Electronically Signed   By: Elgie Collard M.D.   On: 06/29/2020 21:13     Assessment and Recommendation  69  y.o. female with known aortic valve stenosis status post [employer] aortic valve replacement with mechanical valve in 1991 hypertension hyperlipidemia heart block status post dual-chamber pacemaker placement with acute infection now resolved and anemia of unknown source currently stable with aortic aneurysm and dissection 7.6 cm incidental finding of CT scan stable at this time from the cardiovascular standpoint without evidence of congestive heart failure or myocardial infarction or acute coronary syndrome 1.  Continue supportive care and treatment of pneumonia and/or urinary tract infection as before 2.  No further cardiac intervention and/or diagnostics necessary at this time 3.  Reinstatement of warfarin for further risk reduction in thrombosis of mechanical aortic valve at previous dose and will continue in observation with reassessment as an outpatient for adjustments as necessary for goal INR between 2-3 4.  Continue atenolol for heart rate control blood pressure control without change and okay for discharged home on atenolol 100 mg each day 5.  Begin ambulation and follow-up for improvements of symptoms and no further significant symptoms or issues okay for discharge home from the cardiac standpoint with follow-up later this week for adjustments as per above  Signed, Arnoldo Hooker M.D. FACC

## 2020-07-05 NOTE — Evaluation (Signed)
Occupational Therapy Evaluation Patient Details Name: Shelby Werner MRN: [id number] DOB: 09-13-1950 Today's Date: 07/05/2020    History of Present Illness Pt is 69 y/o F with PMH: known AV stenosis s/p AVR in 1991, HTN, HLD, heart block s/p dual chamber PPM and possible dementia (mentioned in chart, but husband is unaware of an official diagnosis per his report) who was brought to ED by her spouse d/t increased confusion and lethargy over 2 days with a ~2 week h/o coughing. Pt found to have sepsis 2/2 PNA. Inicidental finding of Dissecting aneurysm of thoracic aorta with recommendation to f/u for outpatient sx.   Clinical Impression   Pt seen for OT evaluation this date in setting of acute hospitalization with increased confusion and found to have sepsis. Pt is medically improving, but pt and spouse feel she has gotten weaker during hospital stay. Pt is poor historian so most PLOF information gathered from spouse who is present in room throughout. Pt's spouse states that she was able to perform all basic self care I'ly at baseline and he would perform all HH IADLs including yard work and running errands. Pt presents this date with decreased strength and fxl activity tolerance. She requires MIN A with ADL transfers with RW, MIN/MOD A for LB ADLs in sitting, CGA/MIN A with RW for static standing balance during ADLs and CGA/MIN A with fxl mobility with RW. Pt with decreased standing balance placing her at increased risk of falls, and in addition, her increased confusion is concerning with remembering to request assistance. Pt has supportive spouse, but he does endorse some concerns with not having eyes on her at all times if he's working on things around the house. At this time, will recommend SNF to restore pt strength and tolerance for self care. If pt and family choose to d/c home, anticipate pt will require the below-listed equipment as well as 24/7 SUPV and home health therapy.      Follow Up  Recommendations  SNF    Equipment Recommendations  3 in 1 bedside commode;Other (comment) (2WW)    Recommendations for Other Services       Precautions / Restrictions Precautions Precautions: Fall Restrictions Weight Bearing Restrictions: No      Mobility Bed Mobility               General bed mobility comments: up to chair pre/post session    Transfers Overall transfer level: Needs assistance Equipment used: Rolling walker (2 wheeled) Transfers: Sit to/from [employer] Sit to Stand: Min assist Stand pivot transfers: Min assist;Mod assist       General transfer comment: cues for sequencing/hand placement    Balance Overall balance assessment: Needs assistance   Sitting balance-Leahy Scale: Fair       Standing balance-Leahy Scale: Fair Standing balance comment: requires UE support and at least CGA for static standing                           ADL either performed or assessed with clinical judgement   ADL Overall ADL's : Needs assistance/impaired                                       General ADL Comments: Pt requires MIN/MOD A for LB ADLs including bathing/dressing using sit<>stand with RW from [facility] Pt requires SETUP/CGA for sit/lateral lean peri care and SETUP  for seated UB ADLs such as grooming, but MIN A for UB dressing for sequencing cues.     Vision Baseline Vision/History: Wears glasses Wears Glasses: Reading only Patient Visual Report: No change from baseline       Perception     Praxis      Pertinent Vitals/Pain Pain Assessment: Faces Faces Pain Scale: Hurts little more Pain Location: chronic back Pain Descriptors / Indicators: Discomfort Pain Intervention(s): Monitored during session;Repositioned (towel roll supporting cervical spine and recliner laid back some with pillows supporting under bottom and behind back)     Hand Dominance Right   Extremity/Trunk Assessment Upper Extremity  Assessment Upper Extremity Assessment: Generalized weakness   Lower Extremity Assessment Lower Extremity Assessment: Generalized weakness   Cervical / Trunk Assessment Cervical / Trunk Assessment: Kyphotic   Communication Communication Communication: No difficulties   Cognition Arousal/Alertness: Awake/alert Behavior During Therapy: WFL for tasks assessed/performed Overall Cognitive Status: History of cognitive impairments - at baseline                                 General Comments: Pt's spouse reports she's had some memory problems in the past year or so, but states he is not aware of an official diagnosis with dementia. Pt oriented to self and year as well as "hospital" but no other concepts of time, place, or situation. Pt able to follow simple one step commands 100% of the time. Does require some increased processing time.   General Comments       Exercises Other Exercises Other Exercises: OT facilitates ed with pt and spouse re: role of OT and d/c recommendations/options including comparing HH therapy versus STR. Pt's spouse with good understanding and carryover. Pt attentive although she demos poor carryover.   Shoulder Instructions      Home Living Family/patient expects to be discharged to:: Private residence Living Arrangements: Spouse/significant other Available Help at Discharge: Family;Available PRN/intermittently (hubs is retired, but reports while he is home all day, he does all the IADLs including house and yard work and going to grocery, so is not necessarily home with patient 24/7.) Type of Home: House Home Access: Stairs to enter [employer] of Steps: 14 from basement entrance where they typically park. Must cross yard, uphill to get to front entrance which has 4 steps. Both with b/l railing. Entrance Stairs-Rails: Right;Left;Can reach both Home Layout: Two level;Laundry or work area in basement;Bed/bath upstairs Alternate Electrical engineer of Steps: 14 Alternate Level Stairs-Rails: Right;Left;Can reach both     Firefighter: Standard     Home Equipment: TEFL teacher Comments: shower seat from husband's previous knee replacements      Prior Functioning/Environment Level of Independence: Needs assistance  Gait / Transfers Assistance Needed: pt's spouse reports she was able to walk with no AD at baseline and no recent falls. ADL's / Homemaking Assistance Needed: Pt's spouse reports she is able to perform her basic self care at baseline, requires assist for all IADLs including transportation to appts and grocery shopping.            OT Problem List: Decreased strength;Impaired balance (sitting and/or standing);Decreased cognition;Decreased knowledge of precautions;Impaired tone;Pain;Decreased range of motion;Decreased activity tolerance      OT Treatment/Interventions: Self-care/ADL training;Therapeutic activities;Therapeutic exercise;Patient/family education;Balance training    OT Goals(Current goals can be found in the care plan section) Acute Rehab OT Goals Patient Stated Goal: to get better and  go home OT Goal Formulation: With patient/family Time For Goal Achievement: 07/19/20 Potential to Achieve Goals: Fair  OT Frequency: Min 1X/week   Barriers to D/C: Decreased caregiver support  while pt's spouse is retired and available, he reports he is the primary performer of all HH IADLs including running errands and yard work and is concerned about needing to keep eyes on pt at all times.       Co-evaluation              AM-PAC OT "6 Clicks" Daily Activity     Outcome Measure Help from another person eating meals?: None Help from another person taking care of personal grooming?: None Help from another person toileting, which includes using toliet, bedpan, or urinal?: A Little Help from another person bathing (including washing, rinsing, drying)?: A Little Help from another  person to put on and taking off regular upper body clothing?: A Little Help from another person to put on and taking off regular lower body clothing?: A Little 6 Click Score: 20   End of Session Equipment Utilized During Treatment: Gait belt;Rolling walker;Oxygen Nurse Communication: Mobility status;Other (comment) (notified that pt with bath and chair linen change/gown change during OT session)  Activity Tolerance: Patient tolerated treatment well Patient left: in chair;with call bell/phone within reach;with chair alarm set;with family/visitor present  OT Visit Diagnosis: Unsteadiness on feet (R26.81);Muscle weakness (generalized) (M62.81)                Time: 9735-3299 OT Time Calculation (min): 61 min Charges:  OT General Charges $OT Visit: 1 Visit OT Evaluation $OT Eval Moderate Complexity: 1 Mod OT Treatments $Self Care/Home Management : 23-37 mins $Therapeutic Activity: 23-37 mins  Rejeana Brock, MS, OTR/L ascom [phone] 07/05/20, 3:08 PM

## 2020-07-06 DIAGNOSIS — E43 Unspecified severe protein-calorie malnutrition: Secondary | ICD-10-CM | POA: Diagnosis not present

## 2020-07-06 DIAGNOSIS — I10 Essential (primary) hypertension: Secondary | ICD-10-CM

## 2020-07-06 DIAGNOSIS — I5032 Chronic diastolic (congestive) heart failure: Secondary | ICD-10-CM | POA: Diagnosis not present

## 2020-07-06 LAB — BASIC METABOLIC PANEL
Anion gap: 9 (ref 5–15)
BUN: 8 mg/dL (ref 8–23)
CO2: 20 mmol/L — ABNORMAL LOW (ref 22–32)
Calcium: 8.1 mg/dL — ABNORMAL LOW (ref 8.9–10.3)
Chloride: 100 mmol/L (ref 98–111)
Creatinine, Ser: 0.62 mg/dL (ref 0.44–1.00)
GFR, Estimated: >60 mL/min (ref >60)
Glucose, Bld: 112 mg/dL — ABNORMAL HIGH (ref 70–99)
Potassium: 3.6 mmol/L (ref 3.5–5.1)
Sodium: 129 mmol/L — ABNORMAL LOW (ref 135–145)

## 2020-07-06 LAB — BRAIN NATRIURETIC PEPTIDE: B Natriuretic Peptide: 3873.9 pg/mL — ABNORMAL HIGH (ref 0.0–100.0)

## 2020-07-06 LAB — SODIUM: Sodium: 127 mmol/L — ABNORMAL LOW (ref 135–145)

## 2020-07-06 LAB — PROTIME-INR
INR: 1.8 — ABNORMAL HIGH (ref 0.8–1.2)
Prothrombin Time: 20 seconds — ABNORMAL HIGH (ref 11.4–15.2)

## 2020-07-06 LAB — CBC
HCT: 24.2 % — ABNORMAL LOW (ref 36.0–46.0)
Hemoglobin: 8.1 g/dL — ABNORMAL LOW (ref 12.0–15.0)
MCH: 30.3 pg (ref 26.0–34.0)
MCHC: 33.5 g/dL (ref 30.0–36.0)
MCV: 90.6 fL (ref 80.0–100.0)
Platelets: 222 10*3/uL (ref 150–400)
RBC: 2.67 MIL/uL — ABNORMAL LOW (ref 3.87–5.11)
RDW: 15.8 % — ABNORMAL HIGH (ref 11.5–15.5)
WBC: 9.5 10*3/uL (ref 4.0–10.5)
nRBC: 0 % (ref 0.0–0.2)

## 2020-07-06 MED ORDER — POTASSIUM CHLORIDE CRYS ER 20 MEQ PO TBCR
20.0000 meq | EXTENDED_RELEASE_TABLET | Freq: Once | ORAL | Status: AC
  Administered 2020-07-06: 11:00:00 20 meq via ORAL
  Filled 2020-07-06: qty 1

## 2020-07-06 MED ORDER — FUROSEMIDE 40 MG PO TABS
40.0000 mg | ORAL_TABLET | Freq: Every day | ORAL | Status: DC
Start: 2020-07-07 — End: 2020-07-06

## 2020-07-06 MED ORDER — FUROSEMIDE 10 MG/ML IJ SOLN
40.0000 mg | Freq: Two times a day (BID) | INTRAMUSCULAR | Status: DC
  Administered 2020-07-06 – 2020-07-07 (×3): 40 mg via INTRAVENOUS
  Filled 2020-07-06 (×4): qty 4

## 2020-07-06 MED ORDER — WARFARIN SODIUM 5 MG PO TABS
5.0000 mg | ORAL_TABLET | Freq: Once | ORAL | Status: AC
  Administered 2020-07-06: 17:00:00 5 mg via ORAL
  Filled 2020-07-06: qty 1

## 2020-07-06 MED ORDER — FUROSEMIDE 20 MG PO TABS
20.0000 mg | ORAL_TABLET | Freq: Once | ORAL | Status: AC
  Administered 2020-07-06: 11:00:00 20 mg via ORAL
  Filled 2020-07-06: qty 1

## 2020-07-06 NOTE — TOC Initial Note (Signed)
Transition of Care [facility]) - Initial/Assessment Note    Patient Details  Name: Shelby Werner MRN: [id number] Date of Birth: [birth year]  Transition of Care [facility]) CM/SW Contact:    Liliana Cline, LCSW Phone Number: 07/06/2020, 4:14 PM  Clinical Narrative:                CSW spoke with patient's husband via phone. He reported patient lives with him and he drives her to appointments. PCP is Dr. Letitia Libra. [facility]acy is [facility]. Patient has a built in seat in her shower. No SNF or HH history. Explained PT and OT recommendation for SNF. He reported they would prefer home with home health. He reported he is retired so can be with patient 24/7 and feels comfortable meeting her care needs at home when she is discharged. He denied an agency preference. Referral made to [facility] for RN, PT, OT, Aide. Patient's husband also agreeable to RW recommendation, but declines 3 in 1. Referral made to [employer] for RW. Asked MD for orders. Will continue to follow until DC.   Expected Discharge Plan: Home w Home Health Services Barriers to Discharge: Continued Medical Work up   Patient Goals and CMS Choice Patient states their goals for this hospitalization and ongoing recovery are:: home with home health CMS Medicare.gov Compare Post Acute Care list provided to:: Patient Represenative (must comment) Choice offered to / list presented to : Spouse  Expected Discharge Plan and Services Expected Discharge Plan: Home w Home Health Services       Living arrangements for the past 2 months: Single Family Home                 DME Arranged: Walker rolling DME Agency:  [facility]) Date DME Agency Contacted: 07/06/20   Representative spoke with at DME Agency: Vaughan Basta HH Arranged: PT,OT,RN,Nurse's Aide HH Agency: [facility] Date HH Agency Contacted: 07/06/20   Representative spoke with at [facility] Agency: Barbara Cower  Prior Living  Arrangements/Services Living arrangements for the past 2 months: Single Family Home Lives with:: Spouse Patient language and need for interpreter reviewed:: Yes Do you feel safe going back to the place where you live?: Yes      Need for Family Participation in Patient Care: Yes (Comment) Care giver support system in place?: Yes (comment)   Criminal Activity/Legal Involvement Pertinent to Current Situation/Hospitalization: No - Comment as needed  Activities of Daily Living Home Assistive Devices/Equipment: None ADL Screening (condition at time of admission) Patient's cognitive ability adequate to safely complete daily activities?: Yes Is the patient deaf or have difficulty hearing?: No Does the patient have difficulty seeing, even when wearing glasses/contacts?: No Does the patient have difficulty concentrating, remembering, or making decisions?: Yes Patient able to express need for assistance with ADLs?: Yes Does the patient have difficulty dressing or bathing?: Yes Independently performs ADLs?: Yes (appropriate for developmental age) Does the patient have difficulty walking or climbing stairs?: Yes Weakness of Legs: Both Weakness of Arms/Hands: None  Permission Sought/Granted Permission sought to share information with : Facility Industrial/product designer granted to share information with : Yes, Verbal Permission Granted (by spouse)     Permission granted to share info w AGENCY: HH, DME        Emotional Assessment         Alcohol / Substance Use: Not Applicable Psych Involvement: No (comment)  Admission diagnosis:  Lung mass [R91.8] Sepsis (HCC) [A41.9] Community acquired pneumonia of right lung, unspecified  part of lung [J18.9] Sepsis, due to unspecified organism, unspecified whether acute organ dysfunction present [facility]) [A41.9] Patient Active Problem List   Diagnosis Date Noted  . Anemia due to chronic blood loss   . Protein-calorie malnutrition, severe  07/02/2020  . Dissecting aneurysm of thoracic aorta, Stanford type A (HCC) 06/30/2020  . Sepsis (HCC) 06/29/2020  . Hypertension   . Dementia (HCC)   . Lung mass   . Community acquired pneumonia of right lung   . Chronic diastolic CHF (congestive heart failure) (HCC)   . Hypokalemia   . Acute encephalopathy    PCP:  Gracelyn Nurse, MD Pharmacy:   [facility] [phone] [place], [place] - [address] [place] [address] Phone: [phone] Fax: [phone]  [facility] - [place], [place] - [address] [place] [address] Phone: [phone] Fax: [phone]     Social Determinants of Health (SDOH) Interventions    Readmission Risk Interventions No flowsheet data found.

## 2020-07-06 NOTE — Progress Notes (Signed)
PROGRESS NOTE    TAETUM FLEWELLEN   BWG:[id number]  DOB: [birth year]  PCP: Gracelyn Nurse, MD    DOA: 06/29/2020 LOS: 7   Brief Narrative    Shelby Werner is a 69 y.o. 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  Patient found to have RUL opacity on chest xray and right hilar/mediastinal adenopathy versus mass.    Patient admitted for further management of severe sepsis due to pneumonia.    Discussed aortic dissection finding with [facility] cardiothoracic surgeon Dr. Randel Books who felt this to be an incidental finding and likely chronic.  Given patient asymptomatic, hemodynamically stable and currently with pneumonia, transfer to [facility] for surgical evaluation was deferred.  She was provided pt's husband contact information and her clinic is to contact him to set up outpatient appointment for after patient recovered from pneumonia.       Assessment & Plan   Principal Problem:   Sepsis (HCC) Active Problems:   Hypertension   Dementia (HCC)   Lung mass   Community acquired pneumonia of right lung   Chronic diastolic CHF (congestive heart failure) (HCC)   Hypokalemia   Acute encephalopathy   Dissecting aneurysm of thoracic aorta, Stanford type A (HCC)   Protein-calorie malnutrition, severe   Anemia due to chronic blood loss   Hyponatremia - Na Faille to 127 after being NPO for GI eval with maintenance IV fluids.   Na trend: 134>>133>>131>>130>>127>>126>>129x3>>127 Appears to be hypervolemic as patient does have valvular heart disease, edema in the legs and oxygen requirement.  Sodium did improve after IV Lasix given yesterday.   --Follow sodium levels --Diuresis as below   Acute on chronic diastolic CHF and valvular heart  disease- hypovolemic on admission but now appears volume overloaded and sodium improving with diuresis.    Has significant edema in the legs and on supplemental oxygen (none at baseline).  Weight is up since admission as well. --Hold home oral Lasix  20 mg daily --IV Lasix 40 mg twice daily --continue beta-blocker and ARB   --Strict I/O's and daily weights to monitor volume status --Monitor renal function and electrolytes closely   Hypophosphatemia - likely from malnutrition.  Pharmacy consulted for electrolyte replacement. Dietitian concerned for risk of refeeding syndrome.  Monitor daily  Hypokalemia -resolved.  K 2.9 on adm, replaced.  Monitor BMP.    Acute blood loss anemia due to GI bleeding - patient had some melanotic stools and decline in hemoglobin from 10.3 to as low as 7.9. 12/26-28 Hbg relatively stable, no further report of melena. EGD showed large hiatal hernia and esophagitis but no active bleeding.   Suspect slow intermittent blood loss from ulcerations of hiatal hernia vs AVM's more distally. --GI was consulted, has signed off --Resume on warfarin --oral PPI twice daily, indefinitely --Diet resumed --Anemia panel shows iron deficiency, normal B12 and folate --Deferred IV iron infusion due to hypervolemia, consider once further diuresed   Severe Sepsis 2/2 community-acquired PNA - sepsis POA with tachycardia, tachypnea, leukocytosis, in setting of PNA. Elevated lactate and acute encephalopathy reflects organ dysfunction.   Lactic acidosis has resolved and sepsis physiology improved. --Completed Rocephin and Zithromax  --Blood cultures negative   Urinary Tract Infection - pt poor historian so unclear if symptomatic.  Urine cx grew pansensitive E. coli.  Treated with Rocephin as above.  Patient is asymptomatic.   Subtherapeutic INR -  Hx of aortic valve replacement  Initially started on heparin gtt on admission after finding of aortic dissection on imaging, in  case of surgical intervention which is deferred and will be evaluated as outpatient. Resume on warfarin, dosing per pharmacy.  Per cardiology, no need to bridge.    Type A Aortic Dissection - seen on CT chest after xray showed right hilar and mediastinal adenopathy or mass. Transfer to [facility] was pursued, but deferred due to infection and CT surgery felt was incidental finding without urgent need for intervention.  Pt may not be great candidate. [facility] CT surgeon's office (Dr. Randel Books) to contact husband to arrange outpatient follow up.  Patient husband would like to discuss whether to pursue surgical evaluation with her cardiologist Dr. Darrold Junker at follow-up. --target SBP <130 and HR in 60's if possible    Acute metabolic encephalopathy due to infection - 2 day hx confusion over baseline per husband.   At baseline 12/22 on AM rounds per husband.  CT head was negative for acute findings.   Delirium precautions. --resumed on home nortriptyline, Parafon, and Requip which were initially held   Dementia - no behavioral issues.  Appears mild.   12/22 - at baseline mental status per husband. --Delirium precautions    Thrombocytopenia -resolved.  Plt 128k on admission with intermittent lows in past.   Suspect due to sepsis.  Follow CBC.   DVT prophylaxis: scd's for now, to resume warfarin this evening   Diet:  Diet Orders (From admission, onward)    Start     Ordered   07/04/20 0943  Diet Heart Room service appropriate? Yes; Fluid consistency: Thin  Diet effective now       Question Answer Comment  Room service appropriate? Yes   Fluid consistency: Thin      07/04/20 0942            Code Status: Full Code    Subjective 07/06/20    Pt seen with husband at bedside today.  She reports overall feeling fair.  No fevers or chills, no chest pain or worsening shortness of breath.  Does have swelling in the legs.  No nausea vomiting diarrhea or other acute complaints.  Husband is  hopeful for patient to improve with therapy here over the next couple days and hopefully strong enough to return home with home health.     Disposition Plan & Communication   Status is: Inpatient  Inpatient status remains appropriate due to severity of illness.  Hyponatremia likely hypervolemic, improving with diuresis.  Therapy evaluations, expect SNF recommendation.  Dispo: The patient is from: home              Anticipated d/c is to: home              Anticipated d/c date is: 2 days              Patient currently is not medically stable for d/c.  Family Communication: husband at bedside on rounds.    Consults, Procedures, Significant Events   Consultants: None  Procedures:  None  Antimicrobials:  Anti-infectives (From admission, onward)   Start     Dose/Rate Route Frequency Ordered Stop   07/04/20 1400  cefTRIAXone (ROCEPHIN) 1 g in sodium chloride 0.9 % 100 mL IVPB  Status:  Discontinued        1 g 200 mL/hr over 30 Minutes Intravenous Every 24  hours 07/04/20 1328 07/05/20 1122   06/30/20 0900  azithromycin (ZITHROMAX) 500 mg in sodium chloride 0.9 % 250 mL IVPB        500 mg 250 mL/hr over 60 Minutes Intravenous Every 24 hours 06/29/20 2227 07/04/20 0930   06/30/20 0800  cefTRIAXone (ROCEPHIN) 2 g in sodium chloride 0.9 % 100 mL IVPB  Status:  Discontinued        2 g 200 mL/hr over 30 Minutes Intravenous Every 24 hours 06/29/20 2227 07/04/20 1329   06/29/20 2130  vancomycin (VANCOCIN) IVPB 1000 mg/200 mL premix        1,000 mg 200 mL/hr over 60 Minutes Intravenous  Once 06/29/20 2116 06/29/20 2228   06/29/20 2100  piperacillin-tazobactam (ZOSYN) IVPB 3.375 g        3.375 g 100 mL/hr over 30 Minutes Intravenous  Once 06/29/20 2054 06/29/20 2225         Objective   Vitals:   07/05/20 2330 07/05/20 2331 07/06/20 0453 07/06/20 0754  BP:  110/64 108/61 (!) 104/57  Pulse: 65 75 68 70  Resp: Temp: 97.9 F (36.6 C) 97.9 F (36.6 C) 98 F (36.7 C) 98.8  F (37.1 C)  TempSrc: Oral     SpO2:  98% 100% 96%  Weight:      Height:        Intake/Output Summary (Last 24 hours) at 07/06/2020 1507 Last data filed at 07/06/2020 1018 Gross per 24 hour  Intake 120 ml  Output --  Net 120 ml   Filed Weights   07/02/20 0343 07/03/20 0339 07/05/20 0413  Weight: 58.2 kg 57 kg 57.3 kg    Physical Exam:  General exam: awake, sitting up in bed, no acute distress Respiratory system: CTAB with diminished bases no wheezes or rhonchi, normal respiratory effort. Cardiovascular system: normal S1/S2, RRR, systolic murmur, no pedal edema.   Neurologic: no gross focal motor deficits, A&Ox3, normal speech GI: soft and non-tender   Labs   Data Reviewed: I have personally reviewed following labs and imaging studies  CBC: Recent Labs  Lab 06/29/20 1725 06/30/20 0626 07/02/20 0804 07/02/20 1627 07/03/20 0547 07/03/20 0958 07/04/20 0448 07/04/20 1524 07/04/20 2131 07/05/20 0320 07/05/20 1003 07/05/20 1537 07/06/20 0828  WBC 21.8*   < > 15.1*  --  11.9*  --  14.3*  --   --  12.0*  --   --  9.5  NEUTROABS 20.3*  --   --   --   --   --   --   --   --   --   --   --   --   HGB 10.3*   < > 8.9*   < > 8.1*   < > 8.4*   < > 8.4* 8.1* 8.7* 8.6* 8.1*  HCT 31.0*   < > 27.2*   < > 23.4*   < > 25.4*   < > 25.5* 24.5* 25.7* 25.9* 24.2*  MCV 92.5   < > 93.2  --  90.3  --  92.0  --   --  91.8  --   --  90.6  PLT 128*   < > 130*  --  136*  --  166  --   --  190  --   --  222   < > = values in this interval not displayed.   Basic Metabolic Panel: Recent Labs  Lab 06/30/20 0626 07/01/20 0451 07/02/20 0804 07/03/20 0547  07/04/20 0448 07/05/20 0320 07/05/20 1003 07/05/20 1537 07/05/20 2217 07/06/20 0828  NA 134* 133* 131* 130* 127* 126* 126* 129* 129* 129*  K 3.4* 2.9* 3.4* 3.4* 4.1 3.8  --   --   --  3.6  CL 98 102 103 105 102 101  --   --   --  100  CO2 27 23 20* 18* 18* 17*  --   --   --  20*  GLUCOSE 100* 109* 111* 119* 124* 116*  --   --   --   112*  BUN 19 17 13 9 8  7*  --   --   --  8  CREATININE 0.76 0.65 0.56 0.61 0.59 0.63  --   --   --  0.62  CALCIUM 8.2* 7.8* 7.8* 7.7* 7.8* 7.7*  --   --   --  8.1*  MG 2.1 2.0  --   --  2.4 2.0  --   --   --   --   PHOS  --   --   --   --  1.3* 3.0  --   --   --   --    GFR: Estimated Creatinine Clearance: 51.8 mL/min (by C-G formula based on SCr of 0.62 mg/dL). Liver Function Tests: Recent Labs  Lab 06/29/20 1725 07/03/20 0547 07/05/20 0320  AST 23 19  --   ALT 13 14  --   ALKPHOS 80 53  --   BILITOT 1.2 0.5  --   PROT 6.7 5.4*  --   ALBUMIN 3.5 2.5* 2.5*   No results for input(s): LIPASE, AMYLASE in the last 168 hours. No results for input(s): AMMONIA in the last 168 hours. Coagulation Profile: Recent Labs  Lab 07/01/20 0451 07/02/20 0804 07/04/20 0448 07/05/20 0320 07/06/20 0828  INR 1.5* 1.5* 1.7* 1.7* 1.8*   Cardiac Enzymes: No results for input(s): CKTOTAL, CKMB, CKMBINDEX, TROPONINI in the last 168 hours. BNP (last 3 results) No results for input(s): PROBNP in the last 8760 hours. HbA1C: No results for input(s): HGBA1C in the last 72 hours. CBG: Recent Labs  Lab 06/29/20 1724  GLUCAP 107*   Lipid Profile: No results for input(s): CHOL, HDL, LDLCALC, TRIG, CHOLHDL, LDLDIRECT in the last 72 hours. Thyroid Function Tests: Recent Labs    07/05/20 1003  TSH 2.941   Anemia Panel: No results for input(s): VITAMINB12, FOLATE, FERRITIN, TIBC, IRON, RETICCTPCT in the last 72 hours. Sepsis Labs: Recent Labs  Lab 06/29/20 1740 06/29/20 1945 06/29/20 2310 06/30/20 0256 06/30/20 0626 07/01/20 0451  PROCALCITON  --   --   --  11.82 12.22 5.96  LATICACIDVEN 2.3* 2.3* 2.6*  --   --  1.4    Recent Results (from the past 240 hour(s))  Culture, blood (Routine X 2) w Reflex to ID Panel     Status: None   Collection Time: 06/29/20  7:45 PM   Specimen: BLOOD  Result Value Ref Range Status   Specimen Description BLOOD RIGHT ANTECUBITAL  Final   Special  Requests   Final    BOTTLES DRAWN AEROBIC AND ANAEROBIC Blood Culture adequate volume   Culture   Final    NO GROWTH 5 DAYS Performed at [facility], [address] [place], [place] [id number]    Report Status 07/04/2020 FINAL  Final  Urine Culture     Status: Abnormal   Collection Time: 06/29/20  8:33 PM   Specimen: Urine, Random  Result Value Ref  Range Status   Specimen Description   Final    URINE, RANDOM Performed at [facility], [address] [place], [place] [address]    Special Requests   Final    NONE Performed at [facility], [address] [place], [place] [address]    Culture 20,000 COLONIES/mL ESCHERICHIA COLI (A)  Final   Report Status 07/01/2020 FINAL  Final   Organism ID, Bacteria ESCHERICHIA COLI (A)  Final      Susceptibility   Escherichia coli - MIC*    AMPICILLIN 4 SENSITIVE Sensitive     CEFAZOLIN <=4 SENSITIVE Sensitive     CEFEPIME <=0.12 SENSITIVE Sensitive     CEFTRIAXONE <=0.25 SENSITIVE Sensitive     CIPROFLOXACIN <=0.25 SENSITIVE Sensitive     GENTAMICIN <=1 SENSITIVE Sensitive     IMIPENEM <=0.25 SENSITIVE Sensitive     NITROFURANTOIN <=16 SENSITIVE Sensitive     TRIMETH/SULFA <=20 SENSITIVE Sensitive     AMPICILLIN/SULBACTAM <=2 SENSITIVE Sensitive     PIP/TAZO <=4 SENSITIVE Sensitive     * 20,000 COLONIES/mL ESCHERICHIA COLI  Culture, blood (Routine X 2) w Reflex to ID Panel     Status: None   Collection Time: 06/29/20  8:35 PM   Specimen: BLOOD  Result Value Ref Range Status   Specimen Description BLOOD LEFT ANTECUBITAL  Final   Special Requests   Final    BOTTLES DRAWN AEROBIC AND ANAEROBIC Blood Culture results may not be optimal due to an excessive volume of blood received in culture bottles   Culture   Final    NO GROWTH 5 DAYS Performed at [facility], [address] [place], [place] [address]    Report Status 07/04/2020 FINAL  Final  Resp Panel by RT-PCR (Flu A&B, Covid) Nasopharyngeal  Swab     Status: None   Collection Time: 06/29/20 11:13 PM   Specimen: Nasopharyngeal Swab; Nasopharyngeal(NP) swabs in vial transport medium  Result Value Ref Range Status   SARS Coronavirus 2 by RT PCR NEGATIVE NEGATIVE Final    Comment: (NOTE) SARS-CoV-2 target nucleic acids are NOT DETECTED.  The SARS-CoV-2 RNA is generally detectable in upper respiratory specimens during the acute phase of infection. The lowest concentration of SARS-CoV-2 viral copies this assay can detect is 138 copies/mL. A negative result does not preclude SARS-Cov-2 infection and should not be used as the sole basis for treatment or other patient management decisions. A negative result may occur with  improper specimen collection/handling, submission of specimen other than nasopharyngeal swab, presence of viral mutation(s) within the areas targeted by this assay, and inadequate number of viral copies(<138 copies/mL). A negative result must be combined with clinical observations, patient history, and epidemiological information. The expected result is Negative.  Fact Sheet for Patients:  BloggerCourse.com  Fact Sheet for Healthcare Providers:  SeriousBroker.it  This test is no t yet approved or cleared by the [place] FDA and  has been authorized for detection and/or diagnosis of SARS-CoV-2 by FDA under an Emergency Use Authorization (EUA). This EUA will remain  in effect (meaning this test can be used) for the duration of the COVID-19 declaration under Section 564(b)(1) of the Act, 21 U.S.C.section 360bbb-3(b)(1), unless the authorization is terminated  or revoked sooner.       Influenza A by PCR NEGATIVE NEGATIVE Final   Influenza B by PCR NEGATIVE NEGATIVE Final    Comment: (NOTE) The Xpert Xpress SARS-CoV-2/FLU/RSV plus assay is intended as an aid in the diagnosis of  influenza from Nasopharyngeal swab specimens and should not be used as a sole  basis for treatment. Nasal washings and aspirates are unacceptable for Xpert Xpress SARS-CoV-2/FLU/RSV testing.  Fact Sheet for Patients: BloggerCourse.com  Fact Sheet for Healthcare Providers: SeriousBroker.it  This test is not yet approved or cleared by the [place] FDA and has been authorized for detection and/or diagnosis of SARS-CoV-2 by FDA under an Emergency Use Authorization (EUA). This EUA will remain in effect (meaning this test can be used) for the duration of the COVID-19 declaration under Section 564(b)(1) of the Act, 21 U.S.C. section 360bbb-3(b)(1), unless the authorization is terminated or revoked.  Performed at [facility], [address], [place], [place] [address]       Imaging Studies   DG Chest [place] 1 View  Result Date: 07/05/2020 CLINICAL DATA:  69 year old female with hypoxia. Multifocal pneumonia, ascending aortic dissection and aneurysm. EXAM: PORTABLE CHEST 1 VIEW COMPARISON:  Portable chest and CTA chest 06/29/2020 and earlier. FINDINGS: Portable AP semi upright view at 1045 hours. Abnormal contour of the ascending aorta less apparent today. Stable other mediastinal contours. Stable left chest pacemaker, prior sternotomy. Calcified breast implants. Continued confluent right upper lobe opacity corresponding to the dominant area of pneumonia on recent CT. No superimposed pneumothorax or pleural effusion. Ventilation elsewhere appears stable. Stable visualized osseous structures. IMPRESSION: 1. Persistent right upper lobe pneumonia. No areas of worsening ventilation since 06/29/2020. No pleural effusion is evident. 2. Ascending aortic aneurysm and dissection demonstrated on recent CTA. Electronically Signed   By: Odessa Fleming M.D.   On: 07/05/2020 11:03     Medications   Scheduled Meds: . atenolol  100 mg Oral Daily  . chlorzoxazone  500 mg Oral Daily  . feeding supplement  237 mL Oral BID BM  .  [START ON 07/07/2020] furosemide  40 mg Oral Daily  . multivitamin with minerals  1 tablet Oral Daily  . nortriptyline  50 mg Oral QHS  . pantoprazole  40 mg Oral BID AC  . rOPINIRole  0.75 mg Oral QHS  . warfarin  5 mg Oral ONCE-1600  . Warfarin - Pharmacist Dosing Inpatient   Does not apply q1600   Continuous Infusions: . sodium chloride Stopped (07/03/20 1310)       LOS: 7 days    Time spent: 25 minutes with >50% spent in coordination of care and at bedside.    Pennie Banter, DO [facility]  07/06/2020, 3:07 PM    If 7PM-7AM, please contact night-coverage. How to contact the [facility] Attending or Consulting provider 7A - 7P or covering provider during after hours 7P -7A, for this patient?    1. Check the care team in [facility] and look for a) attending/consulting TR[facility] provider listed and b) the [facility] team listed 2. Log into [email or web address] and use 's universal password to access. If you do not have the password, please contact the hospital operator. 3. Locate the [facility] provider you are looking for under [facility] and page to a number that you can be directly reached. 4. If you still have difficulty reaching the provider, please page the [facility] (Director on Call) for the Hospitalists listed on amion for assistance.

## 2020-07-06 NOTE — Evaluation (Signed)
Physical Therapy Evaluation Patient Details Name: Shelby Werner MRN: [id number] DOB: 12/09/1950 Today's Date: 07/06/2020   History of Present Illness  Pt is 69 y/o F with PMH: known AV stenosis s/p AVR in 1991, HTN, HLD, heart block s/p dual chamber PPM and possible dementia (mentioned in chart, but husband is unaware of an official diagnosis per his report) who was brought to ED by her spouse d/t increased confusion and lethargy over 2 days with a ~2 week h/o coughing. Pt found to have sepsis 2/2 PNA. Inicidental finding of Dissecting aneurysm of thoracic aorta with recommendation to f/u for outpatient sx.  Clinical Impression  Pt received sitting at EOB this session with bed alarm sounding and spouse at bedside. The pt reports needing to use restroom. Gait belt applied, formal strength assessment deferred. Pt requires increased time for functional mobility. She is able to perform peri-hygiene with Mod I. She demonstrates limited weight shifting outside of BOS with hand hygiene. Following toileting the pt demonstrates increased RR and work of breathing. Pt had removed O2 prior to mobility. O2 placed and educated on need for use of O2 at all times. The pt would best benefit from short term rehabilitation (SNF) in order to optimize functional mobility.     Follow Up Recommendations SNF    Equipment Recommendations  Rolling walker with 5" wheels    Recommendations for Other Services       Precautions / Restrictions Precautions Precautions: Fall Restrictions Weight Bearing Restrictions: No      Mobility  Bed Mobility Overal bed mobility: Needs Assistance Bed Mobility: Sit to Supine       Sit to supine: Mod assist        Transfers Overall transfer level: Needs assistance Equipment used: Rolling walker (2 wheeled) Transfers: Sit to/from Stand Sit to Stand: Min assist            Ambulation/Gait Ambulation/Gait assistance: Min Chemical engineer (Feet): 30  Feet Assistive device: Rolling walker (2 wheeled) Gait Pattern/deviations: Shuffle;Step-to pattern;Narrow base of support        Stairs            Wheelchair Mobility    Modified Rankin (Stroke Patients Only)       Balance Overall balance assessment: Needs assistance           Standing balance-Leahy Scale: Fair                               Pertinent Vitals/Pain Pain Assessment: No/denies pain    Home Living Family/patient expects to be discharged to:: Private residence Living Arrangements: Spouse/significant other Available Help at Discharge: Family;Available PRN/intermittently Type of Home: House Home Access: Stairs to enter Entrance Stairs-Rails: Right;Left;Can reach both Entrance Stairs-Number of Steps: 14 from basement entrance where they typically park. Must cross yard, uphill to get to front entrance which has 4 steps. Both with b/l railing. Home Layout: Two level;Laundry or work area in basement;Bed/bath upstairs Home Equipment: Environmental education officer Comments: shower seat from husband's previous knee replacements    Prior Function Level of Independence: Independent   Gait / Transfers Assistance Needed: pt's spouse reports she was able to walk with no AD at baseline and no recent falls.           Hand Dominance   Dominant Hand: Right    Extremity/Trunk Assessment   Upper Extremity Assessment Upper Extremity Assessment: Defer to OT evaluation (upon entry the pt  sitting at EOB with bed alarm sounding attempting to get up to go to bathroom.)    Lower Extremity Assessment Lower Extremity Assessment: Overall WFL for tasks assessed    Cervical / Trunk Assessment Cervical / Trunk Assessment: Kyphotic  Communication   Communication: Expressive difficulties  Cognition Arousal/Alertness: Awake/alert Behavior During Therapy: WFL for tasks assessed/performed Overall Cognitive Status: History of cognitive impairments - at baseline                                  General Comments: Pt's spouse reports she's had some memory problems in the past year or so, but states he is not aware of an official diagnosis with dementia. Pt oriented to self and year as well as "hospital" but no other concepts of time, place, or situation. Pt able to follow simple one step commands 100% of the time. Does require some increased processing time.      General Comments      Exercises     Assessment/Plan    PT Assessment Patient needs continued PT services  PT Problem List Decreased strength;Decreased mobility;Decreased range of motion;Decreased activity tolerance;Decreased balance       PT Treatment Interventions      PT Goals (Current goals can be found in the Care Plan section)  Acute Rehab PT Goals Patient Stated Goal: to get better and go home PT Goal Formulation: With patient Time For Goal Achievement: 07/20/20 Potential to Achieve Goals: Fair    Frequency Min 2X/week   Barriers to discharge Decreased caregiver support      Co-evaluation               AM-PAC PT "6 Clicks" Mobility  Outcome Measure Help needed turning from your back to your side while in a flat bed without using bedrails?: A Little Help needed moving from lying on your back to sitting on the side of a flat bed without using bedrails?: A Little Help needed moving to and from a bed to a chair (including a wheelchair)?: A Little Help needed standing up from a chair using your arms (e.g., wheelchair or bedside chair)?: A Little Help needed to walk in hospital room?: A Little Help needed climbing 3-5 steps with a railing? : A Lot 6 Click Score: 17    End of Session Equipment Utilized During Treatment: Gait belt Activity Tolerance: Patient tolerated treatment well;No increased pain Patient left: in bed;with bed alarm set;with family/visitor present   PT Visit Diagnosis: Unsteadiness on feet (R26.81);Muscle weakness (generalized)  (M62.81)    Time: 1610-9604 PT Time Calculation (min) (ACUTE ONLY): 48 min   Charges:   PT Evaluation $PT Eval Low Complexity: 1 Low PT Treatments $Neuromuscular Re-education: 38-52 mins        12:35 PM, 07/06/20 Larri Yehle A. Mordecai Maes PT, DPT Physical Therapist - [facility]   Sharmin Foulk A Alyah Boehning 07/06/2020, 12:31 PM

## 2020-07-06 NOTE — Consult Note (Signed)
ANTICOAGULATION CONSULT NOTE   Pharmacy Consult for Warfarin  Indication: mechanical aortic valve ([employer]. Jude AVR in 1991) Warfarin 5 mg daily (last reported dose was 12/19 per patient's spouse)  No Known Allergies   Labs: Recent Labs    07/04/20 0448 07/04/20 1524 07/05/20 0320 07/05/20 1003 07/05/20 1537 07/06/20 0828  HGB 8.4*   < > 8.1* 8.7* 8.6* 8.1*  HCT 25.4*   < > 24.5* 25.7* 25.9* 24.2*  PLT 166  --  190  --   --  222  LABPROT 19.4*  --  19.6*  --   --  20.0*  INR 1.7*  --  1.7*  --   --  1.8*  CREATININE 0.59  --  0.63  --   --  0.62   < > = values in this interval not displayed.    Estimated Creatinine Clearance: 51.8 mL/min (by C-G formula based on SCr of 0.62 mg/dL).   Medications:  Warfarin 5 mg daily - patient is currently unable to confirm dose. However, per clinic note on 11/23 patient was taking warfarin 5 mg daily. Last warfarin dose was 12/23 (7.5 mg ) @ 1823. Per GI note today, it is okay to resume warfarin. DDI on ceftriaxone. No bridge needed per cardiology.   Assessment: Pharmacy has been consulted for warfarin dosing.  Date INR Dose         Comment 12/26 1.7 7.5 mg 12/27 1.7 5 mg 12/28 1.8  Goal of Therapy:  INR 2.5-3.5 Monitor platelets by anticoagulation protocol: Yes   Plan:  INR subtherapeutic. Will order warfarin 5 mg x1 dose (home dose).Predict INR will continue to trend up. Daily INR ordered. Check CBC every 3 days.   Gardner Candle, PharmD, BCPS 07/06/2020,10:25 AM

## 2020-07-06 NOTE — Consult Note (Signed)
PHARMACY CONSULT NOTE - FOLLOW UP  Pharmacy Consult for Electrolyte Monitoring and Replacement   Recent Labs: Potassium (mmol/L)  Date Value  07/06/2020 3.6   Magnesium (mg/dL)  Date Value  66/29/4765 2.0   Calcium (mg/dL)  Date Value  46/50/3546 8.1 (L)   Albumin (g/dL)  Date Value  56/81/2751 2.5 (L)   Phosphorus (mg/dL)  Date Value  70/07/7492 3.0   Sodium (mmol/L)  Date Value  07/06/2020 129 (L)     Assessment: Pharmacy has been consulted for electrolyte management. 69 y.o.femalewith medical history significant foraortic valve disease status post replacement in 1991 on warfarin, status post pacemaker and ICD, history of CVA, dementia, and chronic diastolic CHF, now presenting to the emergency department for evaluation of cough, lethargy, increased confusion.  Goal of Therapy:  Electrolytes WNL.   Plan:   K: 3.6- tending down. Patient is receiving furosemide 20mg  daily.  Will order KCL x 1 dose.   Will check electrolytes tomorrow with AM labs.   , PharmD, BCPS Clinical Pharmacist 07/06/2020 10:24 AM

## 2020-07-06 NOTE — Plan of Care (Signed)

## 2020-07-07 DIAGNOSIS — F0390 Unspecified dementia without behavioral disturbance: Secondary | ICD-10-CM | POA: Diagnosis not present

## 2020-07-07 DIAGNOSIS — I5032 Chronic diastolic (congestive) heart failure: Secondary | ICD-10-CM | POA: Diagnosis not present

## 2020-07-07 DIAGNOSIS — E871 Hypo-osmolality and hyponatremia: Secondary | ICD-10-CM | POA: Diagnosis not present

## 2020-07-07 LAB — CBC
HCT: 24.5 % — ABNORMAL LOW (ref 36.0–46.0)
Hemoglobin: 8.1 g/dL — ABNORMAL LOW (ref 12.0–15.0)
MCH: 30.2 pg (ref 26.0–34.0)
MCHC: 33.1 g/dL (ref 30.0–36.0)
MCV: 91.4 fL (ref 80.0–100.0)
Platelets: 261 10*3/uL (ref 150–400)
RBC: 2.68 MIL/uL — ABNORMAL LOW (ref 3.87–5.11)
RDW: 15.9 % — ABNORMAL HIGH (ref 11.5–15.5)
WBC: 11.7 10*3/uL — ABNORMAL HIGH (ref 4.0–10.5)
nRBC: 0 % (ref 0.0–0.2)

## 2020-07-07 LAB — BASIC METABOLIC PANEL
Anion gap: 10 (ref 5–15)
BUN: 8 mg/dL (ref 8–23)
CO2: 22 mmol/L (ref 22–32)
Calcium: 8.3 mg/dL — ABNORMAL LOW (ref 8.9–10.3)
Chloride: 97 mmol/L — ABNORMAL LOW (ref 98–111)
Creatinine, Ser: 0.69 mg/dL (ref 0.44–1.00)
GFR, Estimated: >60 mL/min (ref >60)
Glucose, Bld: 122 mg/dL — ABNORMAL HIGH (ref 70–99)
Potassium: 3.2 mmol/L — ABNORMAL LOW (ref 3.5–5.1)
Sodium: 129 mmol/L — ABNORMAL LOW (ref 135–145)

## 2020-07-07 LAB — MAGNESIUM: Magnesium: 1.7 mg/dL (ref 1.7–2.4)

## 2020-07-07 LAB — PROTIME-INR
INR: 1.6 — ABNORMAL HIGH (ref 0.8–1.2)
Prothrombin Time: 18.7 seconds — ABNORMAL HIGH (ref 11.4–15.2)

## 2020-07-07 LAB — PHOSPHORUS: Phosphorus: 2.9 mg/dL (ref 2.5–4.6)

## 2020-07-07 MED ORDER — FERROUS GLUCONATE 324 (38 FE) MG PO TABS
324.0000 mg | ORAL_TABLET | Freq: Every day | ORAL | Status: DC
  Administered 2020-07-08: 09:00:00 324 mg via ORAL
  Filled 2020-07-07 (×2): qty 1

## 2020-07-07 MED ORDER — MAGNESIUM SULFATE 2 GM/50ML IV SOLN
2.0000 g | Freq: Once | INTRAVENOUS | Status: AC
  Administered 2020-07-07: 10:00:00 2 g via INTRAVENOUS
  Filled 2020-07-07: qty 50

## 2020-07-07 MED ORDER — POTASSIUM CHLORIDE CRYS ER 20 MEQ PO TBCR: 30.0000 meq | EXTENDED_RELEASE_TABLET | ORAL | Status: DC

## 2020-07-07 MED ORDER — POTASSIUM CHLORIDE CRYS ER 20 MEQ PO TBCR
40.0000 meq | EXTENDED_RELEASE_TABLET | ORAL | Status: AC
  Administered 2020-07-07 (×2): 40 meq via ORAL
  Filled 2020-07-07 (×2): qty 2

## 2020-07-07 MED ORDER — IPRATROPIUM-ALBUTEROL 0.5-2.5 (3) MG/3ML IN SOLN
3.0000 mL | RESPIRATORY_TRACT | Status: DC | PRN
  Administered 2020-07-07: 15:00:00 3 mL via RESPIRATORY_TRACT
  Filled 2020-07-07 (×2): qty 3

## 2020-07-07 MED ORDER — WARFARIN SODIUM 7.5 MG PO TABS
7.5000 mg | ORAL_TABLET | Freq: Once | ORAL | Status: AC
  Administered 2020-07-07: 17:00:00 7.5 mg via ORAL
  Filled 2020-07-07: qty 1

## 2020-07-07 NOTE — Consult Note (Signed)
PHARMACY CONSULT NOTE - FOLLOW UP  Pharmacy Consult for Electrolyte Monitoring and Replacement   Recent Labs: Potassium (mmol/L)  Date Value  07/07/2020 3.2 (L)   Magnesium (mg/dL)  Date Value  71/21/9758 1.7   Calcium (mg/dL)  Date Value  83/25/4982 8.3 (L)   Albumin (g/dL)  Date Value  64/15/8309 2.5 (L)   Phosphorus (mg/dL)  Date Value  40/76/8088 2.9   Sodium (mmol/L)  Date Value  07/07/2020 129 (L)     Assessment: Pharmacy has been consulted for electrolyte management. 69 y.o.femalewith medical history significant foraortic valve disease status post replacement in 1991 on warfarin, status post pacemaker and ICD, history of CVA, dementia, and chronic diastolic CHF, now presenting to the emergency department for evaluation of cough, lethargy, increased confusion.  Goal of Therapy:  Electrolytes WNL.   Plan:   K: 3.2, Mg: 1.7. Will patient is receiving furosemide 40mg  BID.  Will order KCL x 2 doses and magnesium 2g IV x 1 dose.   Will check electrolytes tomorrow with AM labs.   , PharmD, BCPS Clinical Pharmacist 07/07/2020 8:18 AM

## 2020-07-07 NOTE — Progress Notes (Signed)
Occupational Therapy Treatment Patient Details Name: Shelby Werner MRN: [id number] DOB: 1950/12/24 Today's Date: 07/07/2020    History of present illness Pt is 69 y/o F with PMH: known AV stenosis s/p AVR in 1991, HTN, HLD, heart block s/p dual chamber PPM and possible dementia (mentioned in chart, but husband is unaware of an official diagnosis per his report) who was brought to ED by her spouse d/t increased confusion and lethargy over 2 days with a ~2 week h/o coughing. Pt found to have sepsis 2/2 PNA. Inicidental finding of Dissecting aneurysm of thoracic aorta with recommendation to f/u for outpatient sx.   OT comments  Pt seen for OT treatment on this date. Upon arrival to room pt was awake. Pt was seated upright in bed with spouse present at beginning and end of session. Pt was A&O x 1, reported no pain, and was agreeable to tx. Pt was able to wash UB and LB with washcloth while seated EOB, requiring increased time (~30 mins), frequent rest breaks, verbal cues for energy conservation, and min-mod A. Subsequently, pt participated in seated UB dressing with supervision. After donning one sock, patient starting lying down on bed and closing eyes, demonstrating shallow breathing. SpO2 remained 98-100. Pt was instructed on energy conservation strategies, with pt verbalizing good understanding of instruction provided. Pt continues to benefit from skilled OT services to maximize return to PLOF and minimize risk of future falls, injury, caregiver burden, and readmission. Will continue to follow POC. Discharge recommendation to SNF remains appropriate. Discharge recommendation was discussed with spouse, with spouse verbalizing that he prefer pt return home but would be opening to learning more about SNF options.     Follow Up Recommendations  SNF    Equipment Recommendations  3 in 1 bedside commode;Other (comment) (2WW)       Precautions / Restrictions Precautions Precautions:  Fall Restrictions Weight Bearing Restrictions: No       Mobility Bed Mobility Overal bed mobility: Needs Assistance Bed Mobility: Sit to Supine;Supine to Sit     Supine to sit: Min assist Sit to supine: Mod assist   General bed mobility comments: With HOB elevated, transferred from supine to sitting EOB  Transfers                      Balance Overall balance assessment: Needs assistance Sitting-balance support: No upper extremity supported;Feet supported Sitting balance-Leahy Scale: Fair Sitting balance - Comments: Sitting EOB during functional activities                                   ADL either performed or assessed with clinical judgement   ADL Overall ADL's : Needs assistance/impaired     Grooming: Applying deodorant;Supervision/safety;Set up;Sitting   Upper Body Bathing: Minimal assistance Upper Body Bathing Details (indicate cue type and reason): Required increased time (~20 mins) to complete task in s/o fatigue Lower Body Bathing: Sitting/lateral leans;Moderate assistance Lower Body Bathing Details (indicate cue type and reason): Required increased time (~25mins) to complete task in s/o fatigue Upper Body Dressing : Supervision/safety;Sitting Upper Body Dressing Details (indicate cue type and reason): to don overhead shirt Lower Body Dressing: Maximal assistance;Sitting/lateral leans;Cueing for compensatory techniques;Cueing for safety Lower Body Dressing Details (indicate cue type and reason): to don socks using figure 4 position. Required increased time and break following first sock.  General ADL Comments: While sititing EOB, required significant time to complete bathing and dressing tasks, requiring frequent rest breaks and verbal cues to implement energy conservation strategies               Cognition Arousal/Alertness:  (Fatigue impacting sustained attention throughout session) Behavior During Therapy: WFL  for tasks assessed/performed Overall Cognitive Status: History of cognitive impairments - at baseline                                 General Comments: AxO x1. Able to follow one-step commands. Poor awareness of deficits, requiring cues for energy conservation and safety awareness                   Pertinent Vitals/ Pain       Pain Assessment: No/denies pain         Frequency  Min 1X/week           Plan Discharge plan remains appropriate       AM-PAC OT "6 Clicks" Daily Activity     Outcome Measure   Help from another person eating meals?: A Little Help from another person taking care of personal grooming?: A Little Help from another person toileting, which includes using toliet, bedpan, or urinal?: A Lot Help from another person bathing (including washing, rinsing, drying)?: A Lot Help from another person to put on and taking off regular upper body clothing?: A Little Help from another person to put on and taking off regular lower body clothing?: A Lot 6 Click Score: 15    End of Session    OT Visit Diagnosis: Unsteadiness on feet (R26.81);Muscle weakness (generalized) (M62.81)   Activity Tolerance Patient limited by fatigue   Patient Left with family/visitor present;with bed alarm set;with call bell/phone within reach;in bed   Nurse Communication Mobility status        Time: 7829-5621 OT Time Calculation (min): 47 min  Charges: OT General Charges $OT Visit: 1 Visit OT Treatments $Self Care/Home Management : 38-52 mins  Duwayne Heck [phone]  07/07/2020, 4:38 PM

## 2020-07-07 NOTE — Plan of Care (Signed)

## 2020-07-07 NOTE — Progress Notes (Signed)
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 Labile. Will continue to monitor   Acute on chronic diastolic CHF and valvular heart disease:  Continue on IV lasix. Monitor I/Os & daily weights.   Hypophosphatemia: likely from malnutrition. WNL today  Hypokalemia: KCl repleted. Will continue to monitor   Acute blood loss anemia: w/ IDA. Secondary to GI bleeding. S/p EGD which showed large hiatal hernia and esophagitis but no active bleeding. Continue on PPI. Will start po iron  Severe sepsis: secondary to community-acquired PNA. Meet criteria w/ tachycardia, tachypnea, leukocytosis, in setting of PNA on admission. Elevated lactate and acute encephalopathy reflects organ dysfunction. Lactic acidosis has resolved. Completed abx course. Blood cxs NGTD. Resolved  UTI: urine cx grew e. coli. Completed abx course  Leukocytosis: reactive vs infection. Will continue to monitor   Subtherapeutic INR : w/ hx of aortic valve replacement. Continue on warfarin, monitoring & dosing as per pharmacy.  Type A Aortic Dissection: seen on CT chest after xray showed right hilar and mediastinal adenopathy or mass.Transfer to [facility] was pursued, but deferred due to infection and CT surgery felt was incidental finding without urgent need for intervention.  Pt may not be great candidate. [facility] CT surgeon's office (Dr. Randel Books) to contact husband to arrange outpatient follow up.Target SBP <130 and HR in  60's if possible  Acute metabolic encephalopathy: secondary to infection. CT head was neg for any acute intracranial findings. Continue on home dose of nortripyline, parafon & requip   Dementia: no behavioral issues. Continue w/ supportive care   Thrombocytopenia: resolved   DVT prophylaxis: warfarin  Code Status: full  Family Communication:  Disposition Plan:    Status is: Inpatient  Remains inpatient appropriate because:Persistent severe electrolyte disturbances and IV treatments appropriate due to intensity of illness or inability to take PO   Dispo:  Patient From: Home  Planned Disposition: Home  Expected discharge date: 07/08/2020  Medically stable for discharge: No         Consultants:      Procedures:    Antimicrobials:   Subjective: Pt c/o fatigue   Objective: Vitals:   07/06/20 1623 07/06/20 2007 07/07/20 0010 07/07/20 0446  BP: (!) 102/58 115/68 101/63 108/68  Pulse: 74 80 72 73  Resp: Temp: 97.6 F (36.4 C) 98 F (36.7 C) 98 F (36.7 C) 98 F (36.7 C)  TempSrc:      SpO2: 100% 100% 98% 100%  Weight:      Height:        Intake/Output Summary (Last 24 hours) at 07/07/2020 0820 Last data filed at 07/06/2020 1018 Gross per 24 hour  Intake 120 ml  Output --  Net 120 ml   Filed Weights   07/02/20 0343 07/03/20 0339 07/05/20 0413  Weight: 58.2 kg 57 kg 57.3 kg    Examination:  General exam: Appears calm and comfortable  Respiratory system: decreased breath sounds b/l. No wheezes, rales Cardiovascular system: S1 &  S2 +. No rubs, gallops or clicks.  Gastrointestinal system: Abdomen is nondistended, soft and nontender.  Normal bowel sounds heard. Central nervous system: Alert and oriented. Moves of all 4 extremities Psychiatry: Judgement and insight appear abnormal. Flat mood and affect    Data Reviewed: I have personally reviewed following labs and imaging studies  CBC: Recent Labs  Lab 07/03/20 0547  07/03/20 0958 07/04/20 0448 07/04/20 1524 07/05/20 0320 07/05/20 1003 07/05/20 1537 07/06/20 0828 07/07/20 0554  WBC 11.9*  --  14.3*  --  12.0*  --   --  9.5 11.7*  HGB 8.1*   < > 8.4*   < > 8.1* 8.7* 8.6* 8.1* 8.1*  HCT 23.4*   < > 25.4*   < > 24.5* 25.7* 25.9* 24.2* 24.5*  MCV 90.3  --  92.0  --  91.8  --   --  90.6 91.4  PLT 136*  --  166  --  190  --   --  222 261   < > = values in this interval not displayed.   Basic Metabolic Panel: Recent Labs  Lab 07/01/20 0451 07/02/20 0804 07/03/20 0547 07/04/20 0448 07/05/20 0320 07/05/20 1003 07/05/20 1537 07/05/20 2217 07/06/20 0828 07/06/20 1431 07/07/20 0554  NA 133*   < > 130* 127* 126*   < > 129* 129* 129* 127* 129*  K 2.9*   < > 3.4* 4.1 3.8  --   --   --  3.6  --  3.2*  CL 102   < > 105 102 101  --   --   --  100  --  97*  CO2 23   < > 18* 18* 17*  --   --   --  20*  --  22  GLUCOSE 109*   < > 119* 124* 116*  --   --   --  112*  --  122*  BUN 17   < > 9 8 7*  --   --   --  8  --  8  CREATININE 0.65   < > 0.61 0.59 0.63  --   --   --  0.62  --  0.69  CALCIUM 7.8*   < > 7.7* 7.8* 7.7*  --   --   --  8.1*  --  8.3*  MG 2.0  --   --  2.4 2.0  --   --   --   --   --  1.7  PHOS  --   --   --  1.3* 3.0  --   --   --   --   --  2.9   < > = values in this interval not displayed.   GFR: Estimated Creatinine Clearance: 51.8 mL/min (by C-G formula based on SCr of 0.69 mg/dL). Liver Function Tests: Recent Labs  Lab 07/03/20 0547 07/05/20 0320  AST 19  --   ALT 14  --   ALKPHOS 53  --   BILITOT 0.5  --   PROT 5.4*  --   ALBUMIN 2.5* 2.5*   No results for input(s): LIPASE, AMYLASE in the last 168 hours. No results for input(s): AMMONIA in the last 168 hours. Coagulation Profile: Recent Labs  Lab 07/02/20 0804 07/04/20 0448 07/05/20 0320 07/06/20 0828 07/07/20 0554  INR 1.5* 1.7* 1.7* 1.8* 1.6*   Cardiac Enzymes: No results for input(s): CKTOTAL, CKMB, CKMBINDEX, TROPONINI in the last 168 hours. BNP (last 3  results) No results  for input(s): PROBNP in the last 8760 hours. HbA1C: No results for input(s): HGBA1C in the last 72 hours. CBG: No results for input(s): GLUCAP in the last 168 hours. Lipid Profile: No results for input(s): CHOL, HDL, LDLCALC, TRIG, CHOLHDL, LDLDIRECT in the last 72 hours. Thyroid Function Tests: Recent Labs    07/05/20 1003  TSH 2.941   Anemia Panel: No results for input(s): VITAMINB12, FOLATE, FERRITIN, TIBC, IRON, RETICCTPCT in the last 72 hours. Sepsis Labs: Recent Labs  Lab 07/01/20 0451  PROCALCITON 5.96  LATICACIDVEN 1.4    Recent Results (from the past 240 hour(s))  Culture, blood (Routine X 2) w Reflex to ID Panel     Status: None   Collection Time: 06/29/20  7:45 PM   Specimen: BLOOD  Result Value Ref Range Status   Specimen Description BLOOD RIGHT ANTECUBITAL  Final   Special Requests   Final    BOTTLES DRAWN AEROBIC AND ANAEROBIC Blood Culture adequate volume   Culture   Final    NO GROWTH 5 DAYS Performed at [facility], [address] [place], [place] [address]    Report Status 07/04/2020 FINAL  Final  Urine Culture     Status: Abnormal   Collection Time: 06/29/20  8:33 PM   Specimen: Urine, Random  Result Value Ref Range Status   Specimen Description   Final    URINE, RANDOM Performed at [facility], [address] [place], [place] [address]    Special Requests   Final    NONE Performed at [facility], [address] [place], [place] [address]    Culture 20,000 COLONIES/mL ESCHERICHIA COLI (A)  Final   Report Status 07/01/2020 FINAL  Final   Organism ID, Bacteria ESCHERICHIA COLI (A)  Final      Susceptibility   Escherichia coli - MIC*    AMPICILLIN 4 SENSITIVE Sensitive     CEFAZOLIN <=4 SENSITIVE Sensitive     CEFEPIME <=0.12 SENSITIVE Sensitive     CEFTRIAXONE <=0.25 SENSITIVE Sensitive     CIPROFLOXACIN <=0.25 SENSITIVE Sensitive     GENTAMICIN <=1 SENSITIVE Sensitive     IMIPENEM  <=0.25 SENSITIVE Sensitive     NITROFURANTOIN <=16 SENSITIVE Sensitive     TRIMETH/SULFA <=20 SENSITIVE Sensitive     AMPICILLIN/SULBACTAM <=2 SENSITIVE Sensitive     PIP/TAZO <=4 SENSITIVE Sensitive     * 20,000 COLONIES/mL ESCHERICHIA COLI  Culture, blood (Routine X 2) w Reflex to ID Panel     Status: None   Collection Time: 06/29/20  8:35 PM   Specimen: BLOOD  Result Value Ref Range Status   Specimen Description BLOOD LEFT ANTECUBITAL  Final   Special Requests   Final    BOTTLES DRAWN AEROBIC AND ANAEROBIC Blood Culture results may not be optimal due to an excessive volume of blood received in culture bottles   Culture   Final    NO GROWTH 5 DAYS Performed at [facility], [address] [place], [place] [address]    Report Status 07/04/2020 FINAL  Final  Resp Panel by RT-PCR (Flu A&B, Covid) Nasopharyngeal Swab     Status: None   Collection Time: 06/29/20 11:13 PM   Specimen: Nasopharyngeal Swab; Nasopharyngeal(NP) swabs in vial transport medium  Result Value Ref Range Status   SARS Coronavirus 2 by RT PCR NEGATIVE NEGATIVE Final    Comment: (NOTE) SARS-CoV-2 target nucleic acids are NOT DETECTED.  The SARS-CoV-2 RNA is generally detectable in upper respiratory specimens during  the acute phase of infection. The lowest concentration of SARS-CoV-2 viral copies this assay can detect is 138 copies/mL. A negative result does not preclude SARS-Cov-2 infection and should not be used as the sole basis for treatment or other patient management decisions. A negative result may occur with  improper specimen collection/handling, submission of specimen other than nasopharyngeal swab, presence of viral mutation(s) within the areas targeted by this assay, and inadequate number of viral copies(<138 copies/mL). A negative result must be combined with clinical observations, patient history, and epidemiological information. The expected result is Negative.  Fact Sheet for Patients:   BloggerCourse.com  Fact Sheet for Healthcare Providers:  SeriousBroker.it  This test is no t yet approved or cleared by the [place] FDA and  has been authorized for detection and/or diagnosis of SARS-CoV-2 by FDA under an Emergency Use Authorization (EUA). This EUA will remain  in effect (meaning this test can be used) for the duration of the COVID-19 declaration under Section 564(b)(1) of the Act, 21 U.S.C.section 360bbb-3(b)(1), unless the authorization is terminated  or revoked sooner.       Influenza A by PCR NEGATIVE NEGATIVE Final   Influenza B by PCR NEGATIVE NEGATIVE Final    Comment: (NOTE) The Xpert Xpress SARS-CoV-2/FLU/RSV plus assay is intended as an aid in the diagnosis of influenza from Nasopharyngeal swab specimens and should not be used as a sole basis for treatment. Nasal washings and aspirates are unacceptable for Xpert Xpress SARS-CoV-2/FLU/RSV testing.  Fact Sheet for Patients: BloggerCourse.com  Fact Sheet for Healthcare Providers: SeriousBroker.it  This test is not yet approved or cleared by the [place] FDA and has been authorized for detection and/or diagnosis of SARS-CoV-2 by FDA under an Emergency Use Authorization (EUA). This EUA will remain in effect (meaning this test can be used) for the duration of the COVID-19 declaration under Section 564(b)(1) of the Act, 21 U.S.C. section 360bbb-3(b)(1), unless the authorization is terminated or revoked.  Performed at [facility], [address], [place], [place] [address]          Radiology Studies: [facility] Chest [place] 1 View  Result Date: 07/05/2020 CLINICAL DATA:  69 year old female with hypoxia. Multifocal pneumonia, ascending aortic dissection and aneurysm. EXAM: PORTABLE CHEST 1 VIEW COMPARISON:  Portable chest and CTA chest 06/29/2020 and earlier. FINDINGS: Portable AP semi  upright view at 1045 hours. Abnormal contour of the ascending aorta less apparent today. Stable other mediastinal contours. Stable left chest pacemaker, prior sternotomy. Calcified breast implants. Continued confluent right upper lobe opacity corresponding to the dominant area of pneumonia on recent CT. No superimposed pneumothorax or pleural effusion. Ventilation elsewhere appears stable. Stable visualized osseous structures. IMPRESSION: 1. Persistent right upper lobe pneumonia. No areas of worsening ventilation since 06/29/2020. No pleural effusion is evident. 2. Ascending aortic aneurysm and dissection demonstrated on recent CTA. Electronically Signed   By: Odessa Fleming M.D.   On: 07/05/2020 11:03        Scheduled Meds: . atenolol  100 mg Oral Daily  . chlorzoxazone  500 mg Oral Daily  . feeding supplement  237 mL Oral BID BM  . furosemide  40 mg Intravenous BID  . multivitamin with minerals  1 tablet Oral Daily  . nortriptyline  50 mg Oral QHS  . pantoprazole  40 mg Oral BID AC  . potassium chloride  40 mEq Oral Q4H  . rOPINIRole  0.75 mg Oral QHS  . Warfarin - Pharmacist Dosing Inpatient   Does not apply  q1600   Continuous Infusions: . sodium chloride Stopped (07/03/20 1310)  . magnesium sulfate bolus IVPB       LOS: 8 days    Time spent: 33 mins    Charise Killian, MD [facility] Pager [phone]  If 7PM-7AM, please contact night-coverage 07/07/2020, 8:20 AM

## 2020-07-07 NOTE — Consult Note (Signed)
ANTICOAGULATION CONSULT NOTE   Pharmacy Consult for Warfarin  Indication: mechanical aortic valve ([employer] Jude AVR in 1991) Warfarin 5 mg daily (last reported dose was 12/19 per patient's spouse)  No Known Allergies   Labs: Recent Labs    07/05/20 0320 07/05/20 1003 07/05/20 1537 07/06/20 0828 07/07/20 0554  HGB 8.1*   < > 8.6* 8.1* 8.1*  HCT 24.5*   < > 25.9* 24.2* 24.5*  PLT 190  --   --  222 261  LABPROT 19.6*  --   --  20.0* 18.7*  INR 1.7*  --   --  1.8* 1.6*  CREATININE 0.63  --   --  0.62 0.69   < > = values in this interval not displayed.    Estimated Creatinine Clearance: 51.8 mL/min (by C-G formula based on SCr of 0.69 mg/dL).   Medications:  Warfarin 5 mg daily - patient is currently unable to confirm dose. However, per clinic note on 11/23 patient was taking warfarin 5 mg daily. Last warfarin dose was 12/23 (7.5 mg ) @ 1823. Per GI note today, it is okay to resume warfarin. DDI on ceftriaxone. No bridge needed per cardiology.   Assessment: Pharmacy has been consulted for warfarin dosing.  Date INR Dose         Comment 12/26 1.7 7.5 mg 12/27 1.7 5 mg 12/28 1.8 5 mg 12/29 1.6  Goal of Therapy:  INR 2.5-3.5 Monitor platelets by anticoagulation protocol: Yes   Plan:  INR subtherapeutic. Will order warfarin 7.5 mg x1 dose. Daily INR ordered. Check CBC every 3 days.   Gardner Candle, PharmD, BCPS 07/07/2020,8:20 AM

## 2020-07-08 DIAGNOSIS — I5033 Acute on chronic diastolic (congestive) heart failure: Secondary | ICD-10-CM

## 2020-07-08 DIAGNOSIS — J9601 Acute respiratory failure with hypoxia: Secondary | ICD-10-CM

## 2020-07-08 DIAGNOSIS — F0390 Unspecified dementia without behavioral disturbance: Secondary | ICD-10-CM | POA: Diagnosis not present

## 2020-07-08 LAB — PROTIME-INR
INR: 1.6 — ABNORMAL HIGH (ref 0.8–1.2)
Prothrombin Time: 18.6 seconds — ABNORMAL HIGH (ref 11.4–15.2)

## 2020-07-08 LAB — BASIC METABOLIC PANEL
Anion gap: 10 (ref 5–15)
BUN: 10 mg/dL (ref 8–23)
CO2: 23 mmol/L (ref 22–32)
Calcium: 8.5 mg/dL — ABNORMAL LOW (ref 8.9–10.3)
Chloride: 95 mmol/L — ABNORMAL LOW (ref 98–111)
Creatinine, Ser: 0.83 mg/dL (ref 0.44–1.00)
GFR, Estimated: >60 mL/min (ref >60)
Glucose, Bld: 112 mg/dL — ABNORMAL HIGH (ref 70–99)
Potassium: 4.2 mmol/L (ref 3.5–5.1)
Sodium: 128 mmol/L — ABNORMAL LOW (ref 135–145)

## 2020-07-08 LAB — CBC
HCT: 25.9 % — ABNORMAL LOW (ref 36.0–46.0)
Hemoglobin: 8.6 g/dL — ABNORMAL LOW (ref 12.0–15.0)
MCH: 30.3 pg (ref 26.0–34.0)
MCHC: 33.2 g/dL (ref 30.0–36.0)
MCV: 91.2 fL (ref 80.0–100.0)
Platelets: 275 10*3/uL (ref 150–400)
RBC: 2.84 MIL/uL — ABNORMAL LOW (ref 3.87–5.11)
RDW: 15.9 % — ABNORMAL HIGH (ref 11.5–15.5)
WBC: 11.6 10*3/uL — ABNORMAL HIGH (ref 4.0–10.5)
nRBC: 0 % (ref 0.0–0.2)

## 2020-07-08 MED ORDER — WARFARIN SODIUM 7.5 MG PO TABS
7.5000 mg | ORAL_TABLET | Freq: Once | ORAL | Status: DC
  Filled 2020-07-08: qty 1

## 2020-07-08 MED ORDER — FUROSEMIDE 40 MG PO TABS
40.0000 mg | ORAL_TABLET | Freq: Every day | ORAL | Status: DC
  Administered 2020-07-08: 11:00:00 40 mg via ORAL
  Filled 2020-07-08: qty 1

## 2020-07-08 MED ORDER — PANTOPRAZOLE SODIUM 40 MG PO TBEC: 40.0000 mg | DELAYED_RELEASE_TABLET | Freq: Two times a day (BID) | ORAL | 0 refills | Status: AC

## 2020-07-08 MED ORDER — FERROUS GLUCONATE 324 (38 FE) MG PO TABS: 324.0000 mg | ORAL_TABLET | Freq: Every day | ORAL | 0 refills | Status: AC

## 2020-07-08 MED ORDER — SODIUM CHLORIDE 1 G PO TABS
1.0000 g | ORAL_TABLET | Freq: Two times a day (BID) | ORAL | Status: DC
  Filled 2020-07-08 (×2): qty 1

## 2020-07-08 NOTE — Progress Notes (Signed)
Nutrition Follow-up  DOCUMENTATION CODES:   Severe malnutrition in context of social or environmental circumstances  INTERVENTION:   Add snacks BID  Magic cup TID with meals, each supplement provides 290 kcal and 9 grams of protein  MVI with minerals daily  Boost Breeze po TID, each supplement provides 250 kcal and 9 grams of protein  Recommend liberalizing diet to regular   Discontinue Ensure Enlive as pt refusing all    NUTRITION DIAGNOSIS:   Severe Malnutrition related to social / environmental circumstances (dementia) as evidenced by moderate fat depletion,severe fat depletion,moderate muscle depletion,severe muscle depletion.  Ongoing   GOAL:   Patient will meet greater than or equal to 90% of their needs  Not met  MONITOR:   PO intake,Supplement acceptance,Labs,Weight trends,Skin,I & O's  REASON FOR ASSESSMENT:   Malnutrition Screening Tool    ASSESSMENT:   69 y.o. female with medical history significant for aortic valve disease status post replacement in 1991 on warfarin, status post pacemaker and ICD, CVA, dementia and chronic diastolic CHF who is admitted with sepsis, PNA, UTI and found to have right hilar/mediastinal adenopathy versus mass.  Spoke with pt at bedside. Husband was present during visit to provide additional information. Pt states she has not been eating very much during admission. She reports she feels full shortly after eating. She reports she has been eating some off her tray but not much. She reports she had [place] and mashed potatoes for dinner and also tried the pot roast one day and enjoyed that. Pt could not provide further details of diet. Noted [facility] and sweet tea at bedside. Pt said she is able to drink this and water. Husband reports her appetite is decreased at baseline and that she never consumes breakfast. He reports she consumes 2 small meals a day. Pt reports not drinking any Ensures during her admission because she does not  like the taste. She denies eating [employer] as she reports she does not like ice cream. Per chart review, pt documented intake is 30-95% of last 7 meals.   Pt could not provide weight history. Per chart review, pt admit weight was 102 lbs. Pt current weight documented to be 126 lbs on 07/05/20. Unsure if admit weight is accurate as it is likely stated. Unable to properly quantify weight changes at this time.   Labs reviewed: Sodium 128  Medications reviewed and include: Protonix, po NaCl, MVI with minerals, Lasix, warfarin, Fergon   Diet Order:   Diet Order            Diet - low sodium heart healthy           Diet Heart Room service appropriate? Yes; Fluid consistency: Thin  Diet effective now                 EDUCATION NEEDS:   Education needs have been addressed  Skin:  Skin Assessment: Reviewed RN Assessment (ecchymosis)  Last BM:  12/29  Height:   Ht Readings from Last 1 Encounters:  07/01/20 5' 2"  (1.575 m)    Weight:   Wt Readings from Last 1 Encounters:  07/05/20 57.3 kg    Ideal Body Weight:  50 kg  BMI:  Body mass index is 23.1 kg/m.  Estimated Nutritional Needs:   Kcal:  1500-1700 kcal  Protein:  70-80g/day  Fluid:  1.5L/day    Ronnald Nian, Dietetic Intern Pager: [phone] If unavailable: [phone]

## 2020-07-08 NOTE — Discharge Summary (Signed)
Physician Discharge Summary  Shelby Werner:[id number] DOB: 09-Jun-1951 DOA: 06/29/2020  PCP: Gracelyn Nurse, MD  Admit date: 06/29/2020 Discharge date: 07/08/2020  Admitted From: home Disposition:  Home w/ home health   Recommendations for Outpatient Follow-up:  1. Follow up with PCP in 1 week & will need BMP to check Na level 2. F/u cardio, Dr. Darrold Junker in 1 week 3. F/u [facility] CT surgery, Dr. Randel Books in 1 week   Home Health: yes Equipment/Devices: 2L [place]  Discharge Condition: stable CODE STATUS: full  Diet recommendation: Heart Healthy   Brief/Interim Summary: HPI was taken from Dr. Antionette Char: Shelby Werner is a 69 y.o. 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  Patient's husband reports that patient has been coughing for close to 2 weeks now and has been more lethargic and confused for the past 2 days.  Patient is not able to provide a full history or complete ROS, but she does deny any urinary symptoms, abdominal pain, diarrhea, or vomiting.  ED Course: Upon arrival to the ED, patient is found to be febrile to 38.1 C, tachypneic in the 20s, tachycardic in the 120s, and with stable blood pressure.  EKG features a paced rhythm.  Chest x-ray is concerning for right upper lobe opacity consistent with developing infiltrate, as well as right hilar/mediastinal adenopathy versus mass.  Noncontrast head CT is negative for acute intracranial abnormality.  Chemistry panel notable for potassium 2.9.  CBC features a leukocytosis to 21,800, stable normocytic anemia, and mild thrombocytopenia.  Lactic acid is elevated to 2.3.  Blood cultures were collected and the patient was treated with vancomycin, Zosyn, a liter of LR, and IV  potassium.  From Dr. Denton Lank: Shelby Werner a 69 y.o.femalewith medical history significant foraortic valve disease status post replacement in 1991 on warfarin, status post pacemaker and ICD, history of CVA, dementia, and chronic diastolic CHF, now presenting to the emergency department for evaluation of cough, lethargy, increased confusion.  Patient found to have RUL opacity on chest xray and right hilar/mediastinal adenopathy versus mass.    Patient admitted for further management of severe sepsis due to pneumonia.    Discussed aortic dissection finding with [facility] cardiothoracic surgeon Dr. Randel Books who felt this to be an incidental finding and likely chronic.  Given patient asymptomatic, hemodynamically stable and currently with pneumonia, transfer to [facility] for surgical evaluation was deferred.  She was provided pt's husband contact information and her clinic is to contact him to set up outpatient appointment for after patient recovered from pneumonia.    Hospital Course from Dr. Mayford Knife 12/29-12/30/21: Pt was found to have sepsis secondary to CAP and UTI. Pt did complete an abx course while inpatient. Also, pt was found to have acute on chronic CHF and was treated w/ IV lasix & monitoring of I/Os. Furthermore, pt also had acute blood loss anemia and EGD was done which showed large hiatal hernia & esophagitis but no active bleeding. Pt was placed on PPI. Also, pt was found to have type A aortic dissection & CT surgery recommended that pt be seen by [facility] CT surgery Dr. Randel Books as it was felt to be incidental finding and likely chronic. Pt may not be a great surgical candidate. For more information, please see previous progress/consult notes.  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 Labile. Will continue to monitor   Acute on chronic diastolic CHF and valvular heart disease:  Continue on IV lasix. Monitor I/Os & daily weights.   Acute hypoxic respiratory failure: likely secondary to CHF. Continue on 2L [place] and will be d/c home w/ [place] as well   Hypophosphatemia: likely from malnutrition. WNL today  Hypokalemia: KCl repleted. Will continue to monitor   Acute blood loss anemia: w/ IDA. Secondary to GI bleeding. S/p EGD which showed large hiatal hernia and esophagitis but no active bleeding. Continue on PPI. Will start po iron  Severe sepsis: secondary to community-acquired PNA. Meet criteria w/ tachycardia, tachypnea, leukocytosis, in setting of PNA on admission. Elevated lactate and acute encephalopathy reflects organ dysfunction. Lactic acidosis has resolved. Completed abx course. Blood cxs NGTD. Resolved  UTI: urine cx grew e. coli. Completed abx course  Leukocytosis: reactive vs infection. Will continue to monitor   Subtherapeutic INR : w/ hx of aortic valve replacement. Continue on warfarin, monitoring & dosing as per pharmacy.  Type A Aortic Dissection: seen on CT chest after xray showed right hilar and mediastinal adenopathy or mass.Transfer to [facility] was pursued, but deferred due to infection and CT surgery felt was incidental finding without urgent need for intervention.  Pt may not be great candidate. [facility] CT surgeon's office (Dr. Randel Books) to contact husband to arrange outpatient follow up.Target SBP <130 and HR in 60's if possible  Acute metabolic encephalopathy: secondary to infection. CT head was neg for any acute intracranial findings. Continue on home dose of nortripyline, parafon & requip   Dementia: no behavioral issues. Continue w/  supportive care   Thrombocytopenia: resolved    Discharge Instructions  Discharge Instructions    Diet - low sodium heart healthy   Complete by: As directed    Discharge instructions   Complete by: As directed    F/u w/ PCP in 1 week & will need BMP then as well to check sodium level. F/u cardio, Dr. Darrold Junker, in 1 week. F/u w/ [facility] CT surgery, Dr. Randel Books, to f/u on aortic dissection   Increase activity slowly   Complete by: As directed      Allergies as of 07/08/2020   No Known Allergies     Medication List    STOP taking these medications   celecoxib 100 MG capsule Commonly known as: CELEBREX     TAKE these medications   atenolol 100 MG tablet Commonly known as: TENORMIN Take 100 mg by mouth daily.   butalbital-acetaminophen-caffeine 50-325-40-30 MG capsule Commonly known as: FIORICET WITH CODEINE Take 1 capsule by mouth 4 (four) times daily as needed.   Calcium Carbonate-Vitamin D 600-400 MG-UNIT tablet Take 1 tablet by mouth in the morning and at bedtime.   chlorzoxazone 500 MG tablet Commonly known as: PARAFON Take 500 mg by mouth daily.   ferrous gluconate 324 MG tablet Commonly known as: FERGON Take 1 tablet (324 mg total) by mouth daily with breakfast. Start taking on: July 09, 2020   furosemide 20 MG tablet Commonly known as: LASIX Take 20 mg  by mouth daily.   losartan 100 MG tablet Commonly known as: COZAAR Take 100 mg by mouth daily.   nortriptyline 50 MG capsule Commonly known as: PAMELOR Take 50 mg by mouth at bedtime.   pantoprazole 40 MG tablet Commonly known as: Protonix Take 1 tablet (40 mg total) by mouth 2 (two) times daily.   rOPINIRole 0.25 MG tablet Commonly known as: REQUIP Take 0.75 mg by mouth at bedtime.   traMADol 50 MG tablet Commonly known as: ULTRAM Take 50 mg by mouth every 8 (eight) hours as needed.   warfarin 5 MG tablet Commonly known as: COUMADIN Take 5 mg by mouth daily.             Durable Medical Equipment  (From admission, onward)         Start     Ordered   07/08/20 1230  For home use only DME oxygen  Once       Question Answer Comment  Length of Need 12 Months   Liters per Minute 2   Frequency Continuous (stationary and portable oxygen unit needed)   Oxygen delivery system Gas      07/08/20 1229          Follow-up Information    Gracelyn Nurse, MD Follow up in 1 week(s).   Specialty: Internal Medicine Why: will need lab work, BMP to check sodium level Contact information: [address] [place] [address] [phone]        Marcina Millard, MD Follow up in 1 week(s).   Specialty: Cardiology Contact information: [address] [facility] [place] [address] [phone]              No Known Allergies  Consultations:  CT surg  Cardio   GI   Procedures/Studies: CT HEAD WO CONTRAST  Result Date: 06/29/2020 CLINICAL DATA:  Neuro deficit, acute stroke suspected. Patient has history of dementia. EXAM: CT HEAD WITHOUT CONTRAST TECHNIQUE: Contiguous axial images were obtained from the base of the skull through the vertex without intravenous contrast. COMPARISON:  CT head 11/29/2017 FINDINGS: Brain: Patchy and confluent areas of decreased attenuation are noted throughout the deep and periventricular white matter of the cerebral hemispheres bilaterally, compatible with chronic microvascular ischemic disease. Encephalomalacia of the right temporal and occipital lobes again noted. No evidence of large-territorial acute infarction. No parenchymal hemorrhage. No mass lesion. No extra-axial collection. No mass effect or midline shift. No hydrocephalus. Basilar cisterns are patent. Vascular: No hyperdense vessel. Skull: No acute fracture or focal lesion. Sinuses/Orbits: Paranasal sinuses and mastoid air cells are clear. The orbits are unremarkable. Other: None. IMPRESSION: No acute intracranial  abnormality. Electronically Signed   By: Tish Frederickson M.D.   On: 06/29/2020 18:05   CT CHEST W CONTRAST  Result Date: 06/29/2020 CLINICAL DATA:  69 year old female with abnormal chest radiograph. EXAM: CT CHEST WITH CONTRAST TECHNIQUE: Multidetector CT imaging of the chest was performed during intravenous contrast administration. CONTRAST:  50mL OMNIPAQUE IOHEXOL 300 MG/ML  SOLN COMPARISON:  Earlier radiograph dated 06/29/2020. FINDINGS: Evaluation of this exam is limited due to respiratory motion artifact. Cardiovascular: There is moderate cardiomegaly. No pericardial effusion. There is focal area of calcification adjacent to the mechanical aortic valve. There is aneurysmal dilatation of the aortic root and ascending aorta measuring up to 7.6 cm in AP diameter. There is a dissection flap origin ating from the root of the aorta and extending superiorly in the ascending aorta terminating in the proximal arch  just proximal to the takeoff of the right brachiocephalic trunk. No periaortic fluid collection. No extraluminal contrast. There is a 1.8 x 2.4 cm focal blooming of the anterior wall of the aortic root (75/2). The origins of the great vessels of the aortic arch appear patent as visualized. The descending aorta is tortuous otherwise unremarkable. Evaluation of the pulmonary arteries is limited due to suboptimal opacification and timing of the contrast. A left pectoral pacemaker device noted. Mediastinum/Nodes: Mildly enlarged right hilar lymph nodes, likely reactive. Subcarinal lymph node measures 16 mm in short axis. The esophagus is grossly unremarkable. No mediastinal fluid collection. Lungs/Pleura: There is a large area of opacity in the right upper lobe most consistent with pneumonia. Clinical correlation and follow-up to resolution recommended. Additional scattered clusters of ground-glass opacity involving the right middle lobe and right lower lobe likely pneumonia or aspiration. The left lung  remains clear. There are trace bilateral pleural effusions. No pneumothorax. The central airways are patent. Upper Abdomen: A 6.5 cm cyst in the dome of the liver. Additional smaller hypodense lesions as well as a 12 mm enhancing nodule in the right lobe of the liver and a faint 2.2 x 2.1 cm enhancing lesion in the right lobe of the liver (145/2) are not characterized. Musculoskeletal: Bilateral breast implants with calcified capsule. There is osteopenia with degenerative changes of the spine. Age indeterminate T11 compression fracture with greater than 50% loss of vertebral body height and anterior wedging. Correlation with clinical exam and point tenderness recommended. Median sternotomy wires. IMPRESSION: 1. 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 There is a 1.8 x 2.4 cm focal blooming of the anterior wall of the aortic root. Vascular surgery consult is advised. No periaortic fluid collection or evidence of rupture. 2. Multifocal pneumonia predominantly involving the right upper lobe. 3. Trace bilateral pleural effusions. 4. Moderate cardiomegaly. 5. Age indeterminate T11 compression fracture with greater than 50% loss of vertebral body height and anterior wedging. Correlation with clinical exam and point tenderness recommended. 6. Aortic Atherosclerosis (ICD10-I70.0). These results were called by telephone at the time of interpretation on 06/29/2020 at 11:47 pm to provider TIMOTHY OPYD , who verbally acknowledged these results. Electronically Signed   By: Elgie Collard M.D.   On: 06/29/2020 23:56   DG Chest Port 1 View  Result Date: 07/05/2020 CLINICAL DATA:  70 year old female with hypoxia. Multifocal pneumonia, ascending aortic dissection and aneurysm. EXAM: PORTABLE CHEST 1 VIEW COMPARISON:  Portable chest and CTA chest 06/29/2020 and earlier. FINDINGS: Portable AP semi upright view at 1045 hours. Abnormal  contour of the ascending aorta less apparent today. Stable other mediastinal contours. Stable left chest pacemaker, prior sternotomy. Calcified breast implants. Continued confluent right upper lobe opacity corresponding to the dominant area of pneumonia on recent CT. No superimposed pneumothorax or pleural effusion. Ventilation elsewhere appears stable. Stable visualized osseous structures. IMPRESSION: 1. Persistent right upper lobe pneumonia. No areas of worsening ventilation since 06/29/2020. No pleural effusion is evident. 2. Ascending aortic aneurysm and dissection demonstrated on recent CTA. Electronically Signed   By: Odessa Fleming M.D.   On: 07/05/2020 11:03   DG Chest Portable 1 View  Result Date: 06/29/2020 CLINICAL DATA:  69 year old female with concern for sepsis. EXAM: PORTABLE CHEST 1 VIEW COMPARISON:  Chest radiograph dated 06/24/2009. FINDINGS: Evaluation is limited due to calcified breast implants and portable technique. There is an area of increased opacity in the  right upper lobe which may represent asymmetric edema but concerning for developing infiltrate. Clinical correlation is recommended. Right perihilar density, new since the prior radiograph and may represent hilar/mediastinal adenopathy or mass. Further evaluation with CT is recommended. No large pleural effusion. No pneumothorax. There is mild cardiomegaly. Median sternotomy wires and left pectoral pacemaker device. No acute osseous pathology. IMPRESSION: 1. Right upper lobe opacity concerning for developing infiltrate. 2. Right hilar/mediastinal adenopathy or mass. Further evaluation with CT is recommended. Electronically Signed   By: Elgie CollardArash  Radparvar M.D.   On: 06/29/2020 21:13      Subjective: pt c/o fatigue    Discharge Exam: Vitals:   07/08/20 0810 07/08/20 1154  BP: 105/67 103/64  Pulse: 74 71  Resp: 16 18  Temp: (!) 97.5 F (36.4 C) (!) 97.5 F (36.4 C)  SpO2: 100% 98%   Vitals:   07/08/20 0114 07/08/20 0526  07/08/20 0810 07/08/20 1154  BP: 98/64 100/78 105/67 103/64  Pulse: 75 73 74 71  Resp: 18 18 16 18   Temp: 98.2 F (36.8 C) 97.9 F (36.6 C) (!) 97.5 F (36.4 C) (!) 97.5 F (36.4 C)  TempSrc:      SpO2: 94% 100% 100% 98%  Weight:      Height:        General: Pt is alert, awake, not in acute distress Cardiovascular: S1/S2 +, no rubs, no gallops Respiratory: diminished breath sounds b/l/. No wheezes, rales Abdominal: Soft, NT, ND, bowel sounds + Extremities:  no cyanosis    The results of significant diagnostics from this hospitalization (including imaging, microbiology, ancillary and laboratory) are listed below for reference.     Microbiology: Recent Results (from the past 240 hour(s))  Culture, blood (Routine X 2) w Reflex to ID Panel     Status: None   Collection Time: 06/29/20  7:45 PM   Specimen: BLOOD  Result Value Ref Range Status   Specimen Description BLOOD RIGHT ANTECUBITAL  Final   Special Requests   Final    BOTTLES DRAWN AEROBIC AND ANAEROBIC Blood Culture adequate volume   Culture   Final    NO GROWTH 5 DAYS Performed at [facility], [address] [place], [place] [address]    Report Status 07/04/2020 FINAL  Final  Urine Culture     Status: Abnormal   Collection Time: 06/29/20  8:33 PM   Specimen: Urine, Random  Result Value Ref Range Status   Specimen Description   Final    URINE, RANDOM Performed at [facility], [address] [place], [place] [address]    Special Requests   Final    NONE Performed at [facility], [address] [place], [place] [address]    Culture 20,000 COLONIES/mL ESCHERICHIA COLI (A)  Final   Report Status 07/01/2020 FINAL  Final   Organism ID, Bacteria ESCHERICHIA COLI (A)  Final      Susceptibility   Escherichia coli - MIC*    AMPICILLIN 4 SENSITIVE Sensitive     CEFAZOLIN <=4 SENSITIVE Sensitive     CEFEPIME <=0.12 SENSITIVE Sensitive     CEFTRIAXONE <=0.25 SENSITIVE Sensitive      CIPROFLOXACIN <=0.25 SENSITIVE Sensitive     GENTAMICIN <=1 SENSITIVE Sensitive     IMIPENEM <=0.25 SENSITIVE Sensitive     NITROFURANTOIN <=16 SENSITIVE Sensitive     TRIMETH/SULFA <=20 SENSITIVE Sensitive     AMPICILLIN/SULBACTAM <=2 SENSITIVE Sensitive     PIP/TAZO <=4 SENSITIVE Sensitive     *  20,000 COLONIES/mL ESCHERICHIA COLI  Culture, blood (Routine X 2) w Reflex to ID Panel     Status: None   Collection Time: 06/29/20  8:35 PM   Specimen: BLOOD  Result Value Ref Range Status   Specimen Description BLOOD LEFT ANTECUBITAL  Final   Special Requests   Final    BOTTLES DRAWN AEROBIC AND ANAEROBIC Blood Culture results may not be optimal due to an excessive volume of blood received in culture bottles   Culture   Final    NO GROWTH 5 DAYS Performed at [facility], [address] [place], [place] [address]    Report Status 07/04/2020 FINAL  Final  Resp Panel by RT-PCR (Flu A&B, Covid) Nasopharyngeal Swab     Status: None   Collection Time: 06/29/20 11:13 PM   Specimen: Nasopharyngeal Swab; Nasopharyngeal(NP) swabs in vial transport medium  Result Value Ref Range Status   SARS Coronavirus 2 by RT PCR NEGATIVE NEGATIVE Final    Comment: (NOTE) SARS-CoV-2 target nucleic acids are NOT DETECTED.  The SARS-CoV-2 RNA is generally detectable in upper respiratory specimens during the acute phase of infection. The lowest concentration of SARS-CoV-2 viral copies this assay can detect is 138 copies/mL. A negative result does not preclude SARS-Cov-2 infection and should not be used as the sole basis for treatment or other patient management decisions. A negative result may occur with  improper specimen collection/handling, submission of specimen other than nasopharyngeal swab, presence of viral mutation(s) within the areas targeted by this assay, and inadequate number of viral copies(<138 copies/mL). A negative result must be combined with clinical observations, patient history,  and epidemiological information. The expected result is Negative.  Fact Sheet for Patients:  BloggerCourse.com  Fact Sheet for Healthcare Providers:  SeriousBroker.it  This test is no t yet approved or cleared by the [place] FDA and  has been authorized for detection and/or diagnosis of SARS-CoV-2 by FDA under an Emergency Use Authorization (EUA). This EUA will remain  in effect (meaning this test can be used) for the duration of the COVID-19 declaration under Section 564(b)(1) of the Act, 21 U.S.C.section 360bbb-3(b)(1), unless the authorization is terminated  or revoked sooner.       Influenza A by PCR NEGATIVE NEGATIVE Final   Influenza B by PCR NEGATIVE NEGATIVE Final    Comment: (NOTE) The Xpert Xpress SARS-CoV-2/FLU/RSV plus assay is intended as an aid in the diagnosis of influenza from Nasopharyngeal swab specimens and should not be used as a sole basis for treatment. Nasal washings and aspirates are unacceptable for Xpert Xpress SARS-CoV-2/FLU/RSV testing.  Fact Sheet for Patients: BloggerCourse.com  Fact Sheet for Healthcare Providers: SeriousBroker.it  This test is not yet approved or cleared by the [place] FDA and has been authorized for detection and/or diagnosis of SARS-CoV-2 by FDA under an Emergency Use Authorization (EUA). This EUA will remain in effect (meaning this test can be used) for the duration of the COVID-19 declaration under Section 564(b)(1) of the Act, 21 U.S.C. section 360bbb-3(b)(1), unless the authorization is terminated or revoked.  Performed at [facility], [address] [place], [place] [address]      Labs: BNP (last 3 results) Recent Labs    07/06/20 1431  BNP 3,873.9*   Basic Metabolic Panel: Recent Labs  Lab 07/04/20 0448 07/05/20 0320 07/05/20 1003 07/05/20 2217 07/06/20 0828 07/06/20 1431  07/07/20 0554 07/08/20 0501  NA 127* 126*   < > 129* 129* 127* 129* 128*  K 4.1  3.8  --   --  3.6  --  3.2* 4.2  CL 102 101  --   --  100  --  97* 95*  CO2 18* 17*  --   --  20*  --  22 23  GLUCOSE 124* 116*  --   --  112*  --  122* 112*  BUN 8 7*  --   --  8  --  8 10  CREATININE 0.59 0.63  --   --  0.62  --  0.69 0.83  CALCIUM 7.8* 7.7*  --   --  8.1*  --  8.3* 8.5*  MG 2.4 2.0  --   --   --   --  1.7  --   PHOS 1.3* 3.0  --   --   --   --  2.9  --    < > = values in this interval not displayed.   Liver Function Tests: Recent Labs  Lab 07/03/20 0547 07/05/20 0320  AST 19  --   ALT 14  --   ALKPHOS 53  --   BILITOT 0.5  --   PROT 5.4*  --   ALBUMIN 2.5* 2.5*   No results for input(s): LIPASE, AMYLASE in the last 168 hours. No results for input(s): AMMONIA in the last 168 hours. CBC: Recent Labs  Lab 07/04/20 0448 07/04/20 1524 07/05/20 0320 07/05/20 1003 07/05/20 1537 07/06/20 0828 07/07/20 0554 07/08/20 0501  WBC 14.3*  --  12.0*  --   --  9.5 11.7* 11.6*  HGB 8.4*   < > 8.1* 8.7* 8.6* 8.1* 8.1* 8.6*  HCT 25.4*   < > 24.5* 25.7* 25.9* 24.2* 24.5* 25.9*  MCV 92.0  --  91.8  --   --  90.6 91.4 91.2  PLT 166  --  190  --   --  222 261 275   < > = values in this interval not displayed.   Cardiac Enzymes: No results for input(s): CKTOTAL, CKMB, CKMBINDEX, TROPONINI in the last 168 hours. BNP: Invalid input(s): POCBNP CBG: No results for input(s): GLUCAP in the last 168 hours. D-Dimer No results for input(s): DDIMER in the last 72 hours. Hgb A1c No results for input(s): HGBA1C in the last 72 hours. Lipid Profile No results for input(s): CHOL, HDL, LDLCALC, TRIG, CHOLHDL, LDLDIRECT in the last 72 hours. Thyroid function studies No results for input(s): TSH, T4TOTAL, T3FREE, THYROIDAB in the last 72 hours.  Invalid input(s): FREET3 Anemia work up No results for input(s): VITAMINB12, FOLATE, FERRITIN, TIBC, IRON, RETICCTPCT in the last 72 hours. Urinalysis     Component Value Date/Time   COLORURINE YELLOW (A) 06/29/2020 2033   APPEARANCEUR CLEAR (A) 06/29/2020 2033   LABSPEC 1.019 06/29/2020 2033   PHURINE 5.0 06/29/2020 2033   GLUCOSEU NEGATIVE 06/29/2020 2033   HGBUR SMALL (A) 06/29/2020 2033   BILIRUBINUR NEGATIVE 06/29/2020 2033   KETONESUR NEGATIVE 06/29/2020 2033   PROTEINUR 100 (A) 06/29/2020 2033   NITRITE NEGATIVE 06/29/2020 2033   LEUKOCYTESUR NEGATIVE 06/29/2020 2033   Sepsis Labs Invalid input(s): PROCALCITONIN,  WBC,  LACTICIDVEN Microbiology Recent Results (from the past 240 hour(s))  Culture, blood (Routine X 2) w Reflex to ID Panel     Status: None   Collection Time: 06/29/20  7:45 PM   Specimen: BLOOD  Result Value Ref Range Status   Specimen Description BLOOD RIGHT ANTECUBITAL  Final   Special Requests   Final    BOTTLES DRAWN AEROBIC  AND ANAEROBIC Blood Culture adequate volume   Culture   Final    NO GROWTH 5 DAYS Performed at [facility], [address] [place], [place] [address]    Report Status 07/04/2020 FINAL  Final  Urine Culture     Status: Abnormal   Collection Time: 06/29/20  8:33 PM   Specimen: Urine, Random  Result Value Ref Range Status   Specimen Description   Final    URINE, RANDOM Performed at [facility], [address], [place], [place] [address]    Special Requests   Final    NONE Performed at [facility], [address] [place], [place] [address]    Culture 20,000 COLONIES/mL ESCHERICHIA COLI (A)  Final   Report Status 07/01/2020 FINAL  Final   Organism ID, Bacteria ESCHERICHIA COLI (A)  Final      Susceptibility   Escherichia coli - MIC*    AMPICILLIN 4 SENSITIVE Sensitive     CEFAZOLIN <=4 SENSITIVE Sensitive     CEFEPIME <=0.12 SENSITIVE Sensitive     CEFTRIAXONE <=0.25 SENSITIVE Sensitive     CIPROFLOXACIN <=0.25 SENSITIVE Sensitive     GENTAMICIN <=1 SENSITIVE Sensitive     IMIPENEM <=0.25 SENSITIVE Sensitive     NITROFURANTOIN <=16  SENSITIVE Sensitive     TRIMETH/SULFA <=20 SENSITIVE Sensitive     AMPICILLIN/SULBACTAM <=2 SENSITIVE Sensitive     PIP/TAZO <=4 SENSITIVE Sensitive     * 20,000 COLONIES/mL ESCHERICHIA COLI  Culture, blood (Routine X 2) w Reflex to ID Panel     Status: None   Collection Time: 06/29/20  8:35 PM   Specimen: BLOOD  Result Value Ref Range Status   Specimen Description BLOOD LEFT ANTECUBITAL  Final   Special Requests   Final    BOTTLES DRAWN AEROBIC AND ANAEROBIC Blood Culture results may not be optimal due to an excessive volume of blood received in culture bottles   Culture   Final    NO GROWTH 5 DAYS Performed at [facility], [address] [place], [place] [address]    Report Status 07/04/2020 FINAL  Final  Resp Panel by RT-PCR (Flu A&B, Covid) Nasopharyngeal Swab     Status: None   Collection Time: 06/29/20 11:13 PM   Specimen: Nasopharyngeal Swab; Nasopharyngeal(NP) swabs in vial transport medium  Result Value Ref Range Status   SARS Coronavirus 2 by RT PCR NEGATIVE NEGATIVE Final    Comment: (NOTE) SARS-CoV-2 target nucleic acids are NOT DETECTED.  The SARS-CoV-2 RNA is generally detectable in upper respiratory specimens during the acute phase of infection. The lowest concentration of SARS-CoV-2 viral copies this assay can detect is 138 copies/mL. A negative result does not preclude SARS-Cov-2 infection and should not be used as the sole basis for treatment or other patient management decisions. A negative result may occur with  improper specimen collection/handling, submission of specimen other than nasopharyngeal swab, presence of viral mutation(s) within the areas targeted by this assay, and inadequate number of viral copies(<138 copies/mL). A negative result must be combined with clinical observations, patient history, and epidemiological information. The expected result is Negative.  Fact Sheet for Patients:  BloggerCourse.com  Fact  Sheet for Healthcare Providers:  SeriousBroker.it  This test is no t yet approved or cleared by the [place] FDA and  has been authorized for detection and/or diagnosis of SARS-CoV-2 by FDA under an Emergency Use Authorization (EUA). This EUA will remain  in effect (meaning this test can be used)  for the duration of the COVID-19 declaration under Section 564(b)(1) of the Act, 21 U.S.C.section 360bbb-3(b)(1), unless the authorization is terminated  or revoked sooner.       Influenza A by PCR NEGATIVE NEGATIVE Final   Influenza B by PCR NEGATIVE NEGATIVE Final    Comment: (NOTE) The Xpert Xpress SARS-CoV-2/FLU/RSV plus assay is intended as an aid in the diagnosis of influenza from Nasopharyngeal swab specimens and should not be used as a sole basis for treatment. Nasal washings and aspirates are unacceptable for Xpert Xpress SARS-CoV-2/FLU/RSV testing.  Fact Sheet for Patients: BloggerCourse.com  Fact Sheet for Healthcare Providers: SeriousBroker.it  This test is not yet approved or cleared by the [place] FDA and has been authorized for detection and/or diagnosis of SARS-CoV-2 by FDA under an Emergency Use Authorization (EUA). This EUA will remain in effect (meaning this test can be used) for the duration of the COVID-19 declaration under Section 564(b)(1) of the Act, 21 U.S.C. section 360bbb-3(b)(1), unless the authorization is terminated or revoked.  Performed at [facility], [address] [place], [place] [address]      Time coordinating discharge: Over 30 minutes  SIGNED:   Charise Killian, MD  [facility] 07/08/2020, 1:27 PM Pager   If 7PM-7AM, please contact night-coverage

## 2020-07-08 NOTE — Progress Notes (Signed)
SATURATION QUALIFICATIONS:   Patient Saturations on Room Air at Rest = 96%  Patient Saturations on Room Air while Ambulating = 79%  Patient Saturations on 2 Liters of oxygen while Ambulating = 90%

## 2020-07-08 NOTE — Progress Notes (Signed)
Discharge instruction given to husband. He verbalized understanding and all questions were answered.

## 2020-07-08 NOTE — TOC Progression Note (Signed)
Transition of Care [facility]) - Progression Note    Patient Details  Name: Shelby Werner MRN: [id number] Date of Birth: 1950/10/25  [facility] of Care [facility] CM/SW Contact  Trenton Founds, RN Phone Number: 07/08/2020, 12:45 PM  Clinical Narrative:   RNCM reached out to [facility] with [employer] re oxygen need for patient. He reported they will be able to deliver this afternoon.      Expected Discharge Plan: Home w Home Health Services Barriers to Discharge: Continued Medical Work up  Expected Discharge Plan and Services Expected Discharge Plan: Home w Home Health Services       Living arrangements for the past 2 months: Single Family Home                 DME Arranged: Walker rolling DME Agency:  Loyal Buba) Date DME Agency Contacted: 07/06/20   Representative spoke with at DME Agency: Vaughan Basta HH Arranged: PT,OT,RN,Nurse's Aide HH Agency: [facility] Date HH Agency Contacted: 07/06/20   Representative spoke with at [facility] Agency: Barbara Cower   Social Determinants of Health (SDOH) Interventions    Readmission Risk Interventions No flowsheet data found.

## 2020-07-08 NOTE — Care Management Important Message (Signed)
Important Message  Patient Details  Name: Shelby Werner MRN: [id number] Date of Birth: 04-14-1951   Medicare Important Message Given:  Yes     Olegario Messier A Cleopha Indelicato 07/08/2020, 11:09 AM

## 2020-07-08 NOTE — Plan of Care (Signed)

## 2020-07-08 NOTE — Consult Note (Signed)
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   Medications:  Warfarin 5 mg daily - patient is currently unable to confirm dose. However, per clinic note on 11/23 patient was taking warfarin 5 mg daily. Last warfarin dose was 12/23 (7.5 mg ) @ 1823. Per GI note today, it is okay to resume warfarin. DDI on ceftriaxone. No bridge needed per cardiology.   Assessment: Pharmacy has been consulted for warfarin dosing.  Date INR Dose         Comment 12/26 1.7 7.5 mg 12/27 1.7 5 mg 12/28 1.8 5 mg 12/29 1.6 7.5mg  12/30 1.6  Goal of Therapy:  INR 2.5-3.5 Monitor platelets by anticoagulation protocol: Yes   Plan:  INR remains subtherapeutic. Will order warfarin 7.5 mg x1 dose. Daily INR ordered. Check CBC every 3 days.   Gardner Candle, PharmD, BCPS 07/08/2020,8:32 AM

## 2020-08-10 DEATH — deceased

## 2022-02-02 IMAGING — CT CT CHEST W/ CM
2 of 3 series · 14 of 36 positions shown, 17 images · IV contrast (omnipaque)
Comparison: Earlier radiograph dated 06/29/2020.

CLINICAL DATA: 69-year-old female with abnormal chest radiograph.

EXAM:
CT CHEST WITH CONTRAST
TECHNIQUE: Multidetector CT imaging of the chest was performed during
intravenous contrast administration.
CONTRAST:  50mL OMNIPAQUE IOHEXOL 300 MG/ML  SOLN

[Series 2: axial (person_name) (person_name) · axial · 0.59mm/px · z∈[-322,-66]mm · 11 of 152 slices shown, 14 images]
[im 12/152  mediastinal]
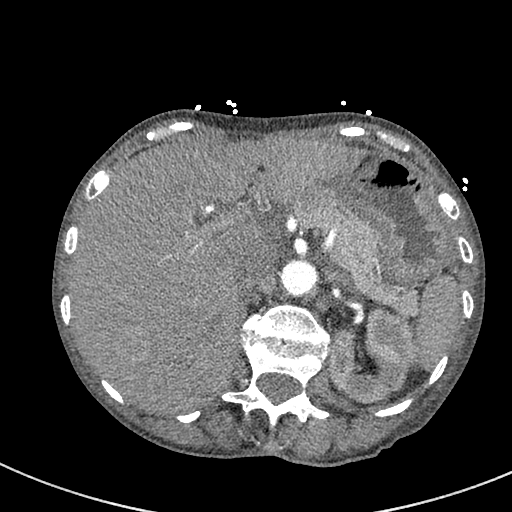
[im 12/152  lung]
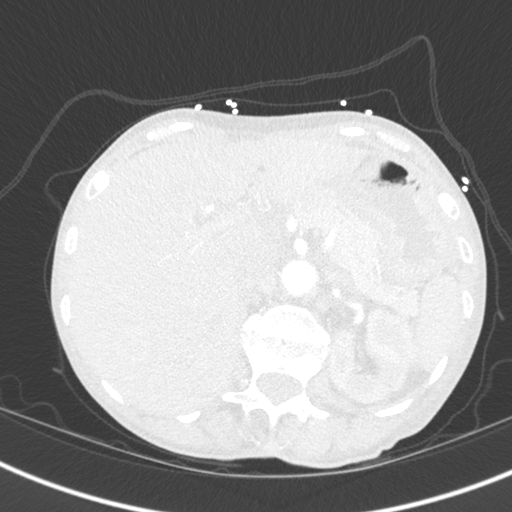
[im 23/152  lung]
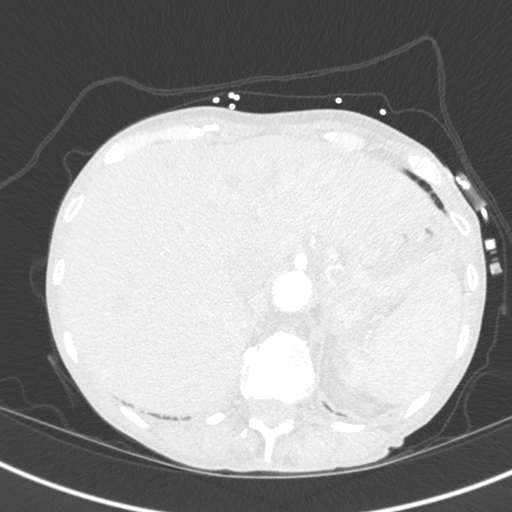
[im 34/152  lung]
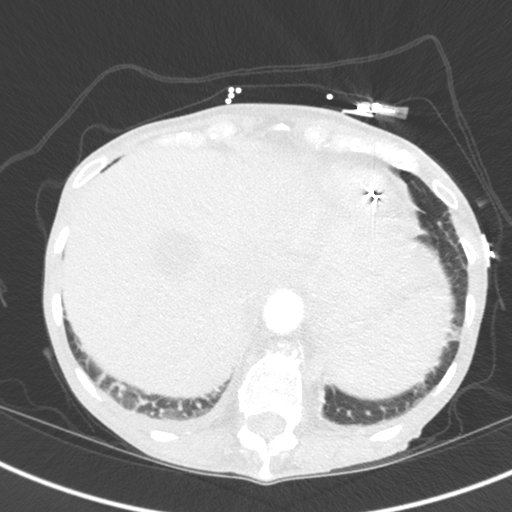
[im 51/152  lung]
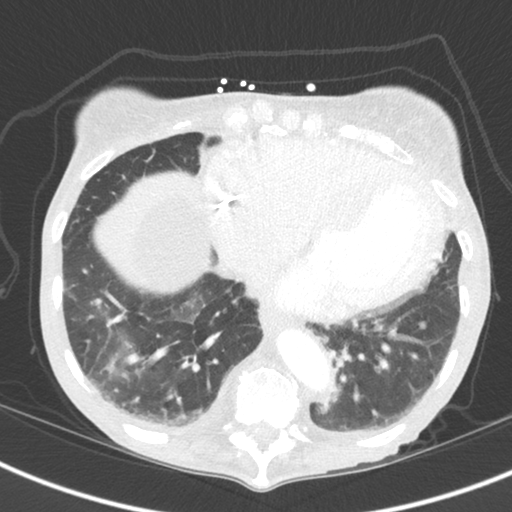
[im 62/152  mediastinal]
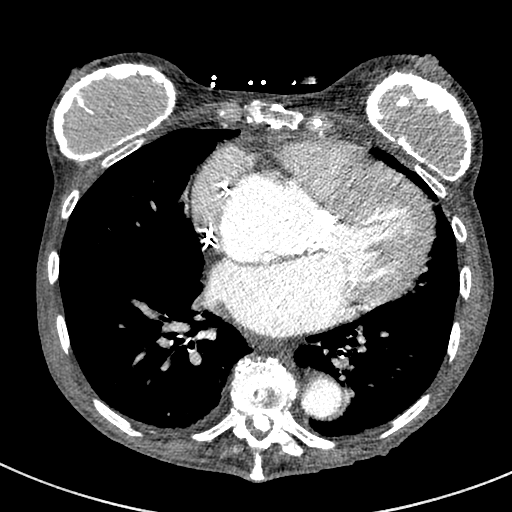
[im 62/152  lung]
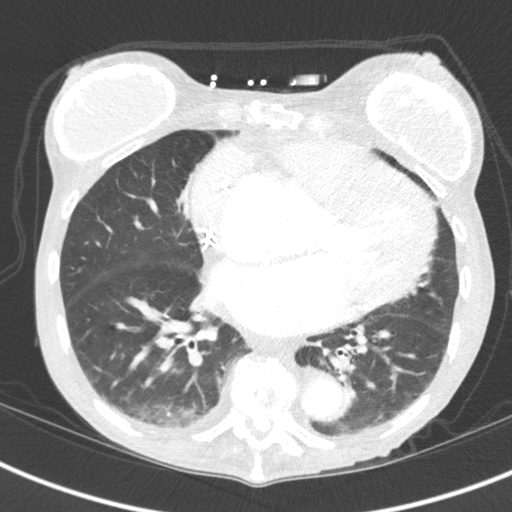
[im 79/152  lung]
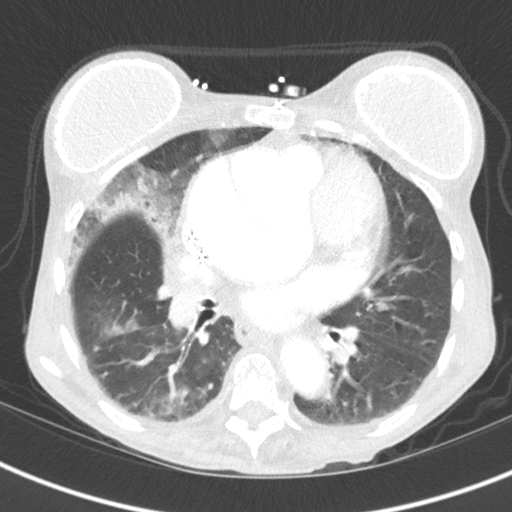
[im 90/152  lung]
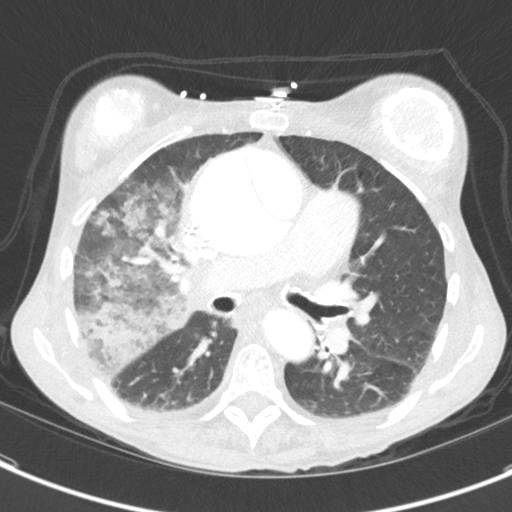
[im 101/152  lung]
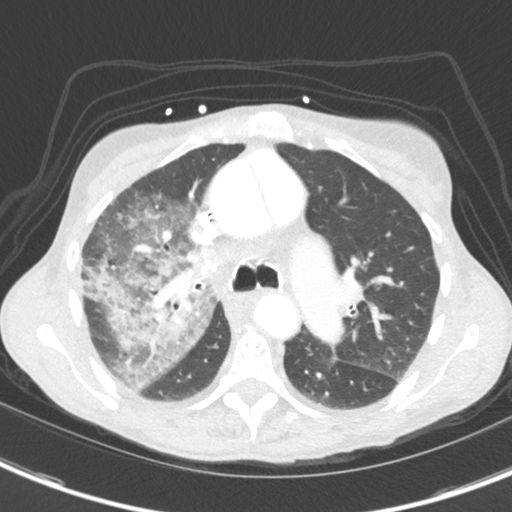
[im 118/152  mediastinal]
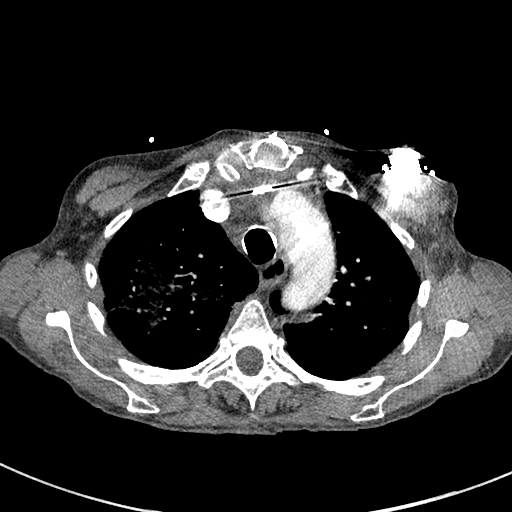
[im 118/152  lung]
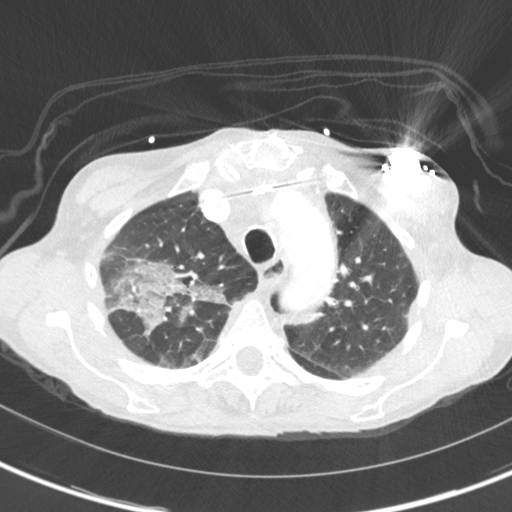
[im 129/152  lung]
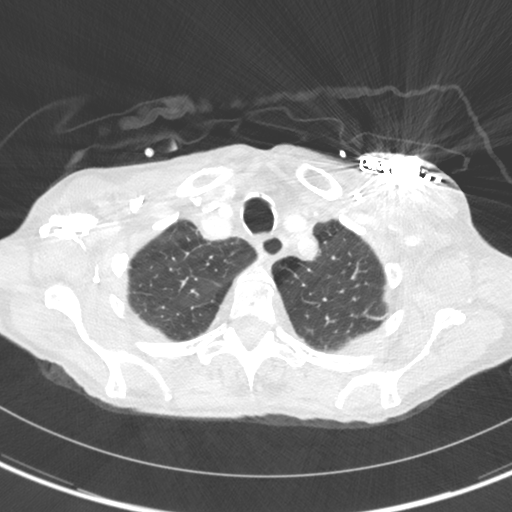
[im 140/152  lung]
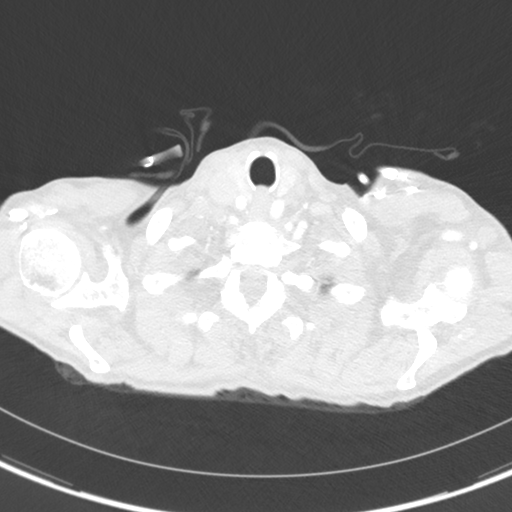

[Series 5: coronal · coronal · 0.58mm/px · 3 of 130 slices shown]
[im 26/130  lung]
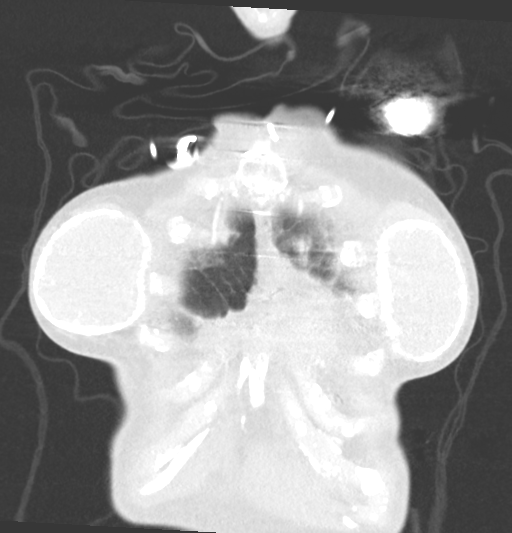
[im 52/130  lung]
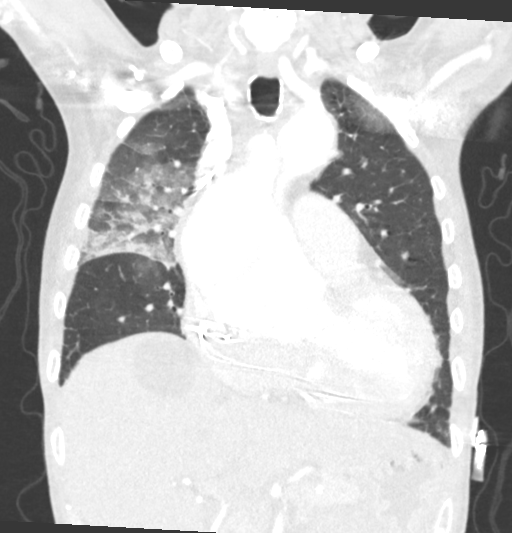
[im 78/130  lung]
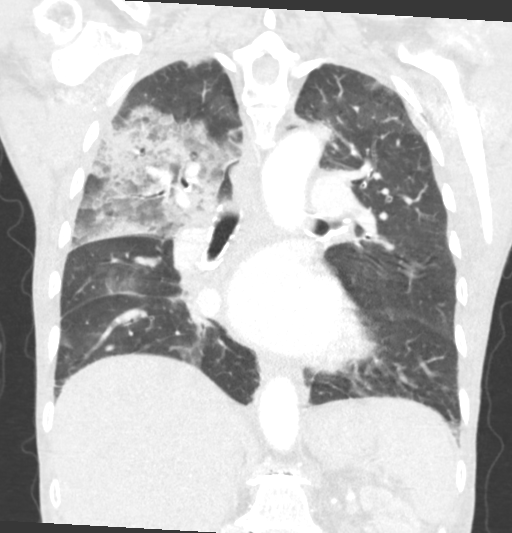

[14 of 36 positions shown; findings below may reference images not displayed]

FINDINGS: Evaluation of this exam is limited due to respiratory motion
artifact.

Cardiovascular: There is moderate cardiomegaly. No pericardial
effusion. There is focal area of calcification adjacent to the
mechanical aortic valve.

There is aneurysmal dilatation of the aortic root and ascending
aorta measuring up to 7.6 cm in AP diameter. There is a dissection
flap origin ating from the root of the aorta and extending
superiorly in the ascending aorta terminating in the proximal arch
just proximal to the takeoff of the right brachiocephalic trunk. No
periaortic fluid collection. No extraluminal contrast.

There is a 1.8 x 2.4 cm focal blooming of the anterior wall of the
aortic root (75/2). The origins of the great vessels of the aortic
arch appear patent as visualized. The descending aorta is tortuous
otherwise unremarkable.

Evaluation of the pulmonary arteries is limited due to suboptimal
opacification and timing of the contrast.

A left pectoral pacemaker device noted.

Mediastinum/Nodes: Mildly enlarged right hilar lymph nodes, likely
reactive. Subcarinal lymph node measures 16 mm in short axis. The
esophagus is grossly unremarkable. No mediastinal fluid collection.

Lungs/Pleura: There is a large area of opacity in the right upper
lobe most consistent with pneumonia. Clinical correlation and
follow-up to resolution recommended. Additional scattered clusters
of ground-glass opacity involving the right middle lobe and right
lower lobe likely pneumonia or aspiration. The left lung remains
clear. There are trace bilateral pleural effusions. No pneumothorax.
The central airways are patent.

Upper Abdomen: A 6.5 cm cyst in the dome of the liver. Additional
smaller hypodense lesions as well as a 12 mm enhancing nodule in the
right lobe of the liver and a faint 2.2 x 2.1 cm enhancing lesion in
the right lobe of the liver (145/2) are not characterized.

Musculoskeletal: Bilateral breast implants with calcified capsule.
There is osteopenia with degenerative changes of the spine. Age
indeterminate T11 compression fracture with greater than 50% loss of
vertebral body height and anterior wedging. Correlation with
clinical exam and point tenderness recommended. Median sternotomy
wires.
IMPRESSION: 1. Type A aortic dissection with aneurysmal dilatation of the aortic
root and ascending aorta measuring up to 7.6 cm in AP diameter.
There is a 1.8 x 2.4 cm focal blooming of the anterior wall of the
aortic root. Vascular surgery consult is advised. No periaortic
fluid collection or evidence of rupture.
2. Multifocal pneumonia predominantly involving the right upper
lobe.
3. Trace bilateral pleural effusions.
4. Moderate cardiomegaly.
5. Age indeterminate T11 compression fracture with greater than 50%
loss of vertebral body height and anterior wedging. Correlation with
clinical exam and point tenderness recommended.
6. Aortic Atherosclerosis ([id number]).

These results were called by telephone at the time of interpretation
on 06/29/2020 at [DATE] to provider YA MONG , who verbally
acknowledged these results.
